# Patient Record
Sex: Male | Born: 1935 | Race: White | Hispanic: No | Smoking: Former smoker
Health system: Southern US, Community
[De-identification: ages and names within clinical notes are randomized; demographics above are authoritative.]

## PROBLEM LIST (undated history)

## (undated) DIAGNOSIS — I714 Abdominal aortic aneurysm, without rupture, unspecified: Secondary | ICD-10-CM

## (undated) DIAGNOSIS — M545 Low back pain, unspecified: Secondary | ICD-10-CM

## (undated) DIAGNOSIS — J309 Allergic rhinitis, unspecified: Secondary | ICD-10-CM

## (undated) DIAGNOSIS — G510 Bell's palsy: Secondary | ICD-10-CM

## (undated) DIAGNOSIS — F0390 Unspecified dementia without behavioral disturbance: Secondary | ICD-10-CM

## (undated) DIAGNOSIS — I4891 Unspecified atrial fibrillation: Secondary | ICD-10-CM

## (undated) DIAGNOSIS — E785 Hyperlipidemia, unspecified: Secondary | ICD-10-CM

## (undated) DIAGNOSIS — R569 Unspecified convulsions: Secondary | ICD-10-CM

## (undated) DIAGNOSIS — G911 Obstructive hydrocephalus: Secondary | ICD-10-CM

## (undated) DIAGNOSIS — C801 Malignant (primary) neoplasm, unspecified: Secondary | ICD-10-CM

## (undated) DIAGNOSIS — I10 Essential (primary) hypertension: Secondary | ICD-10-CM

## (undated) DIAGNOSIS — C439 Malignant melanoma of skin, unspecified: Secondary | ICD-10-CM

## (undated) DIAGNOSIS — K635 Polyp of colon: Secondary | ICD-10-CM

## (undated) DIAGNOSIS — N400 Enlarged prostate without lower urinary tract symptoms: Secondary | ICD-10-CM

## (undated) DIAGNOSIS — I639 Cerebral infarction, unspecified: Secondary | ICD-10-CM

## (undated) DIAGNOSIS — N200 Calculus of kidney: Secondary | ICD-10-CM

## (undated) HISTORY — PX: COLONOSCOPY: SHX174

## (undated) HISTORY — DX: Obstructive hydrocephalus: G91.1

## (undated) HISTORY — DX: Unspecified dementia, unspecified severity, without behavioral disturbance, psychotic disturbance, mood disturbance, and anxiety: F03.90

## (undated) HISTORY — DX: Malignant (primary) neoplasm, unspecified: C80.1

## (undated) HISTORY — DX: Bell's palsy: G51.0

## (undated) HISTORY — DX: Unspecified convulsions: R56.9

## (undated) HISTORY — DX: Hyperlipidemia, unspecified: E78.5

## (undated) HISTORY — DX: Allergic rhinitis, unspecified: J30.9

## (undated) HISTORY — DX: Abdominal aortic aneurysm, without rupture, unspecified: I71.40

## (undated) HISTORY — DX: Polyp of colon: K63.5

## (undated) HISTORY — DX: Essential (primary) hypertension: I10

## (undated) HISTORY — PX: COLON SURGERY: SHX602

## (undated) HISTORY — DX: Low back pain: M54.5

## (undated) HISTORY — DX: Low back pain, unspecified: M54.50

## (undated) HISTORY — PX: NASAL SINUS SURGERY: SHX719

## (undated) HISTORY — DX: Abdominal aortic aneurysm, without rupture: I71.4

## (undated) HISTORY — DX: Cerebral infarction, unspecified: I63.9

## (undated) HISTORY — PX: CHOLECYSTECTOMY: SHX55

## (undated) HISTORY — DX: Calculus of kidney: N20.0

## (undated) HISTORY — DX: Unspecified atrial fibrillation: I48.91

## (undated) HISTORY — DX: Benign prostatic hyperplasia without lower urinary tract symptoms: N40.0

## (undated) HISTORY — DX: Malignant melanoma of skin, unspecified: C43.9

---

## 1999-02-26 ENCOUNTER — Inpatient Hospital Stay (HOSPITAL_COMMUNITY): Admission: EM | Admit: 1999-02-26 | Discharge: 1999-02-27 | Payer: Self-pay | Admitting: *Deleted

## 2000-09-16 DIAGNOSIS — R569 Unspecified convulsions: Secondary | ICD-10-CM

## 2000-09-16 HISTORY — DX: Unspecified convulsions: R56.9

## 2005-11-17 HISTORY — PX: POLYPECTOMY: SHX149

## 2006-03-27 ENCOUNTER — Ambulatory Visit: Payer: Self-pay | Admitting: Cardiology

## 2008-07-17 HISTORY — PX: MELANOMA EXCISION: SHX5266

## 2009-04-24 ENCOUNTER — Ambulatory Visit (HOSPITAL_COMMUNITY): Admission: RE | Admit: 2009-04-24 | Discharge: 2009-04-24 | Payer: Self-pay | Admitting: Neurology

## 2009-08-18 ENCOUNTER — Ambulatory Visit (HOSPITAL_COMMUNITY): Admission: RE | Admit: 2009-08-18 | Discharge: 2009-08-18 | Payer: Self-pay | Admitting: Neurosurgery

## 2009-08-20 ENCOUNTER — Encounter: Admission: RE | Admit: 2009-08-20 | Discharge: 2009-08-20 | Payer: Self-pay | Admitting: Neurology

## 2009-11-03 ENCOUNTER — Inpatient Hospital Stay (HOSPITAL_COMMUNITY): Admission: RE | Admit: 2009-11-03 | Discharge: 2009-11-04 | Payer: Self-pay | Admitting: Neurosurgery

## 2009-12-03 ENCOUNTER — Encounter: Admission: RE | Admit: 2009-12-03 | Discharge: 2009-12-03 | Payer: Self-pay | Admitting: Neurosurgery

## 2010-01-11 ENCOUNTER — Ambulatory Visit: Payer: Self-pay

## 2010-01-11 ENCOUNTER — Ambulatory Visit: Payer: Self-pay | Admitting: Cardiovascular Disease

## 2010-01-11 ENCOUNTER — Encounter (INDEPENDENT_AMBULATORY_CARE_PROVIDER_SITE_OTHER): Payer: Self-pay | Admitting: Neurology

## 2010-01-11 ENCOUNTER — Ambulatory Visit (HOSPITAL_COMMUNITY): Admission: RE | Admit: 2010-01-11 | Discharge: 2010-01-11 | Payer: Self-pay | Admitting: Neurology

## 2010-01-11 ENCOUNTER — Encounter: Payer: Self-pay | Admitting: Internal Medicine

## 2010-11-16 NOTE — Procedures (Signed)
Summary: summary report  summary report   Imported By: Mirna Mires 01/14/2010 11:31:34  _____________________________________________________________________  External Attachment:    Type:   Image     Comment:   External Document

## 2011-01-02 LAB — BASIC METABOLIC PANEL
CO2: 26 mEq/L (ref 19–32)
Calcium: 9.4 mg/dL (ref 8.4–10.5)
Chloride: 103 mEq/L (ref 96–112)
GFR calc Af Amer: >60 mL/min (ref >60)
Glucose, Bld: 109 mg/dL — ABNORMAL HIGH (ref 70–99)
Potassium: 4.2 mEq/L (ref 3.5–5.1)
Sodium: 139 mEq/L (ref 135–145)

## 2011-01-02 LAB — CBC
HCT: 44.1 % (ref 39.0–52.0)
Hemoglobin: 15.4 g/dL (ref 13.0–17.0)
MCHC: 34.8 g/dL (ref 30.0–36.0)
RBC: 4.53 MIL/uL (ref 4.22–5.81)

## 2011-01-02 LAB — DIFFERENTIAL
Basophils Relative: 0 % (ref 0–1)
Eosinophils Relative: 3 % (ref 0–5)
Monocytes Absolute: 0.6 10*3/uL (ref 0.1–1.0)
Monocytes Relative: 11 % (ref 3–12)
Neutro Abs: 3.5 10*3/uL (ref 1.7–7.7)

## 2011-01-19 LAB — CBC
HCT: 46 % (ref 39.0–52.0)
MCV: 96.6 fL (ref 78.0–100.0)
Platelets: 183 10*3/uL (ref 150–400)
RDW: 13.3 % (ref 11.5–15.5)

## 2011-01-19 LAB — BASIC METABOLIC PANEL
BUN: 17 mg/dL (ref 6–23)
Chloride: 103 mEq/L (ref 96–112)
Creatinine, Ser: 0.99 mg/dL (ref 0.4–1.5)
GFR calc non Af Amer: >60 mL/min (ref >60)
Glucose, Bld: 102 mg/dL — ABNORMAL HIGH (ref 70–99)
Potassium: 4.2 mEq/L (ref 3.5–5.1)

## 2011-01-19 LAB — ABO/RH: ABO/RH(D): O POS

## 2011-01-19 LAB — TYPE AND SCREEN: ABO/RH(D): O POS

## 2011-01-19 LAB — DIFFERENTIAL
Basophils Absolute: 0 10*3/uL (ref 0.0–0.1)
Lymphocytes Relative: 35 % (ref 12–46)
Neutro Abs: 2 10*3/uL (ref 1.7–7.7)
Neutrophils Relative %: 46 % (ref 43–77)

## 2011-01-19 IMAGING — CR DG CHEST 2V
2 series · 2 of 2 positions shown · non-contrast
Comparison: None

CLINICAL DATA: Hydrocephalus.  Preadmission shunt placement.

CHEST - 2 VIEW

[view not recorded (1 of 2)]
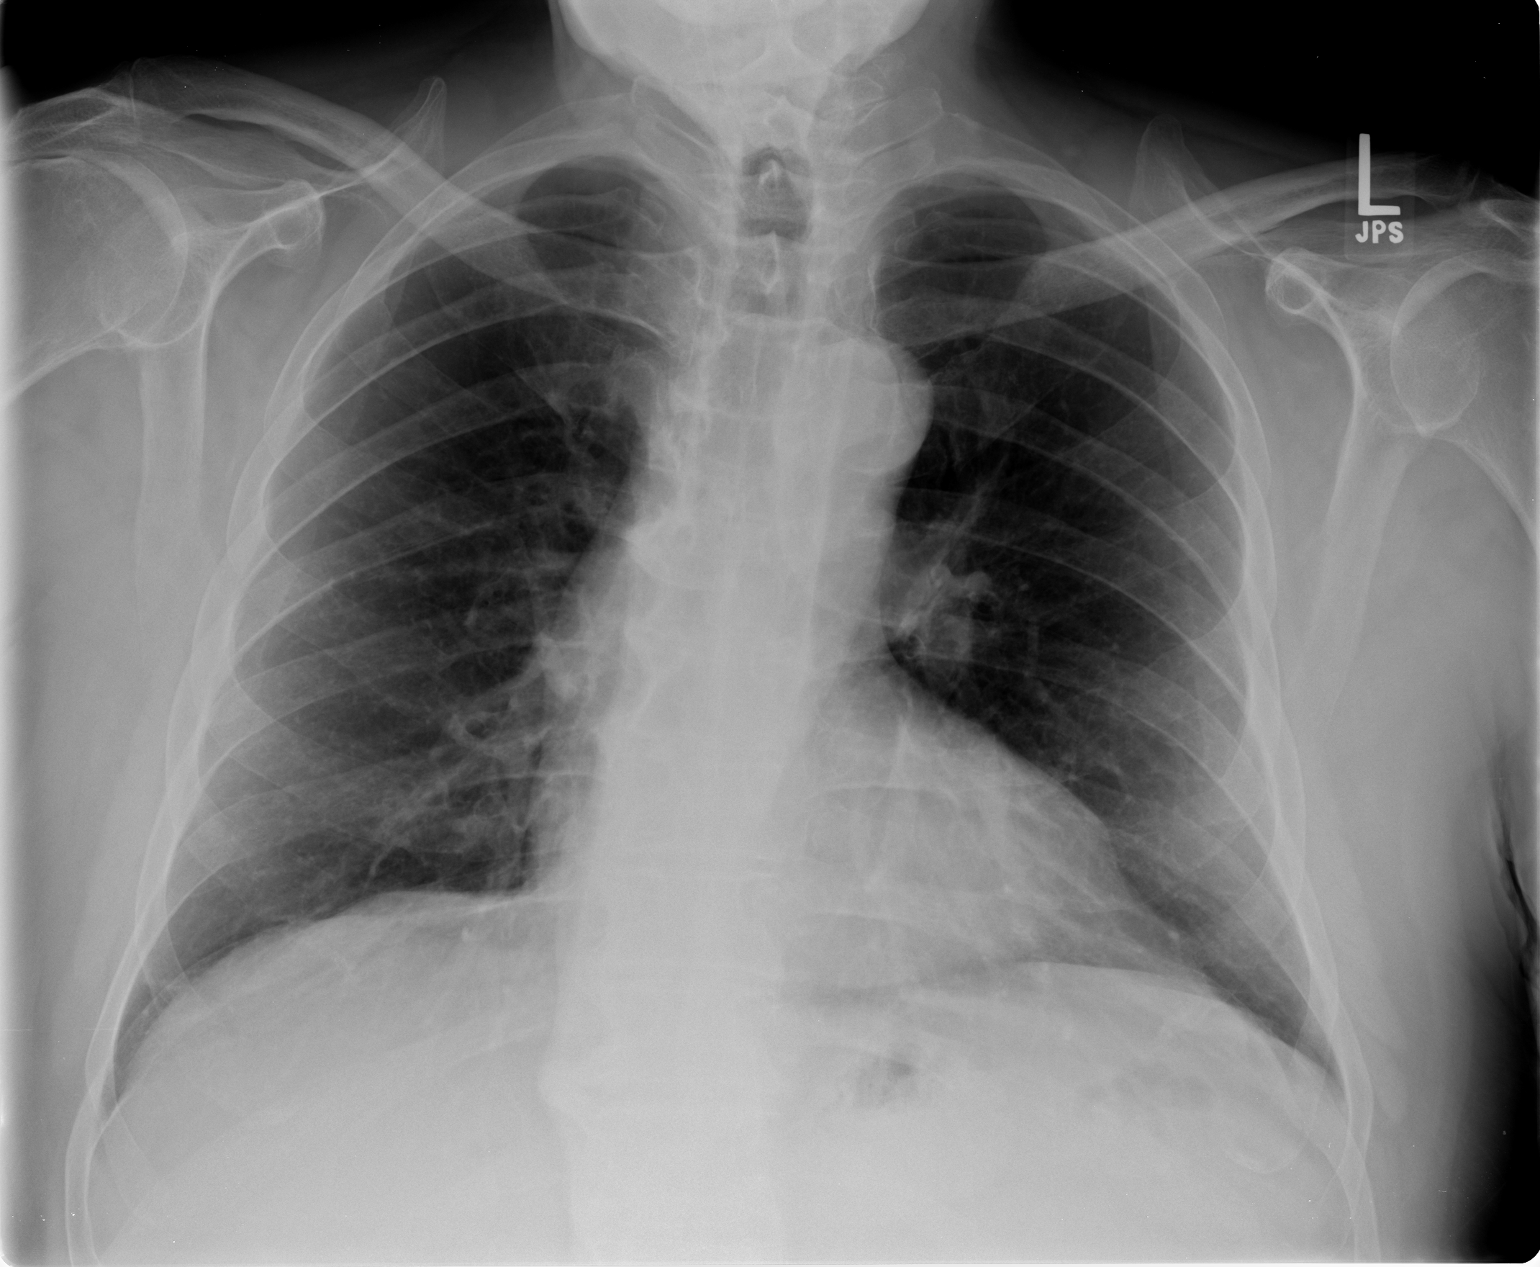

[view not recorded (2 of 2)]
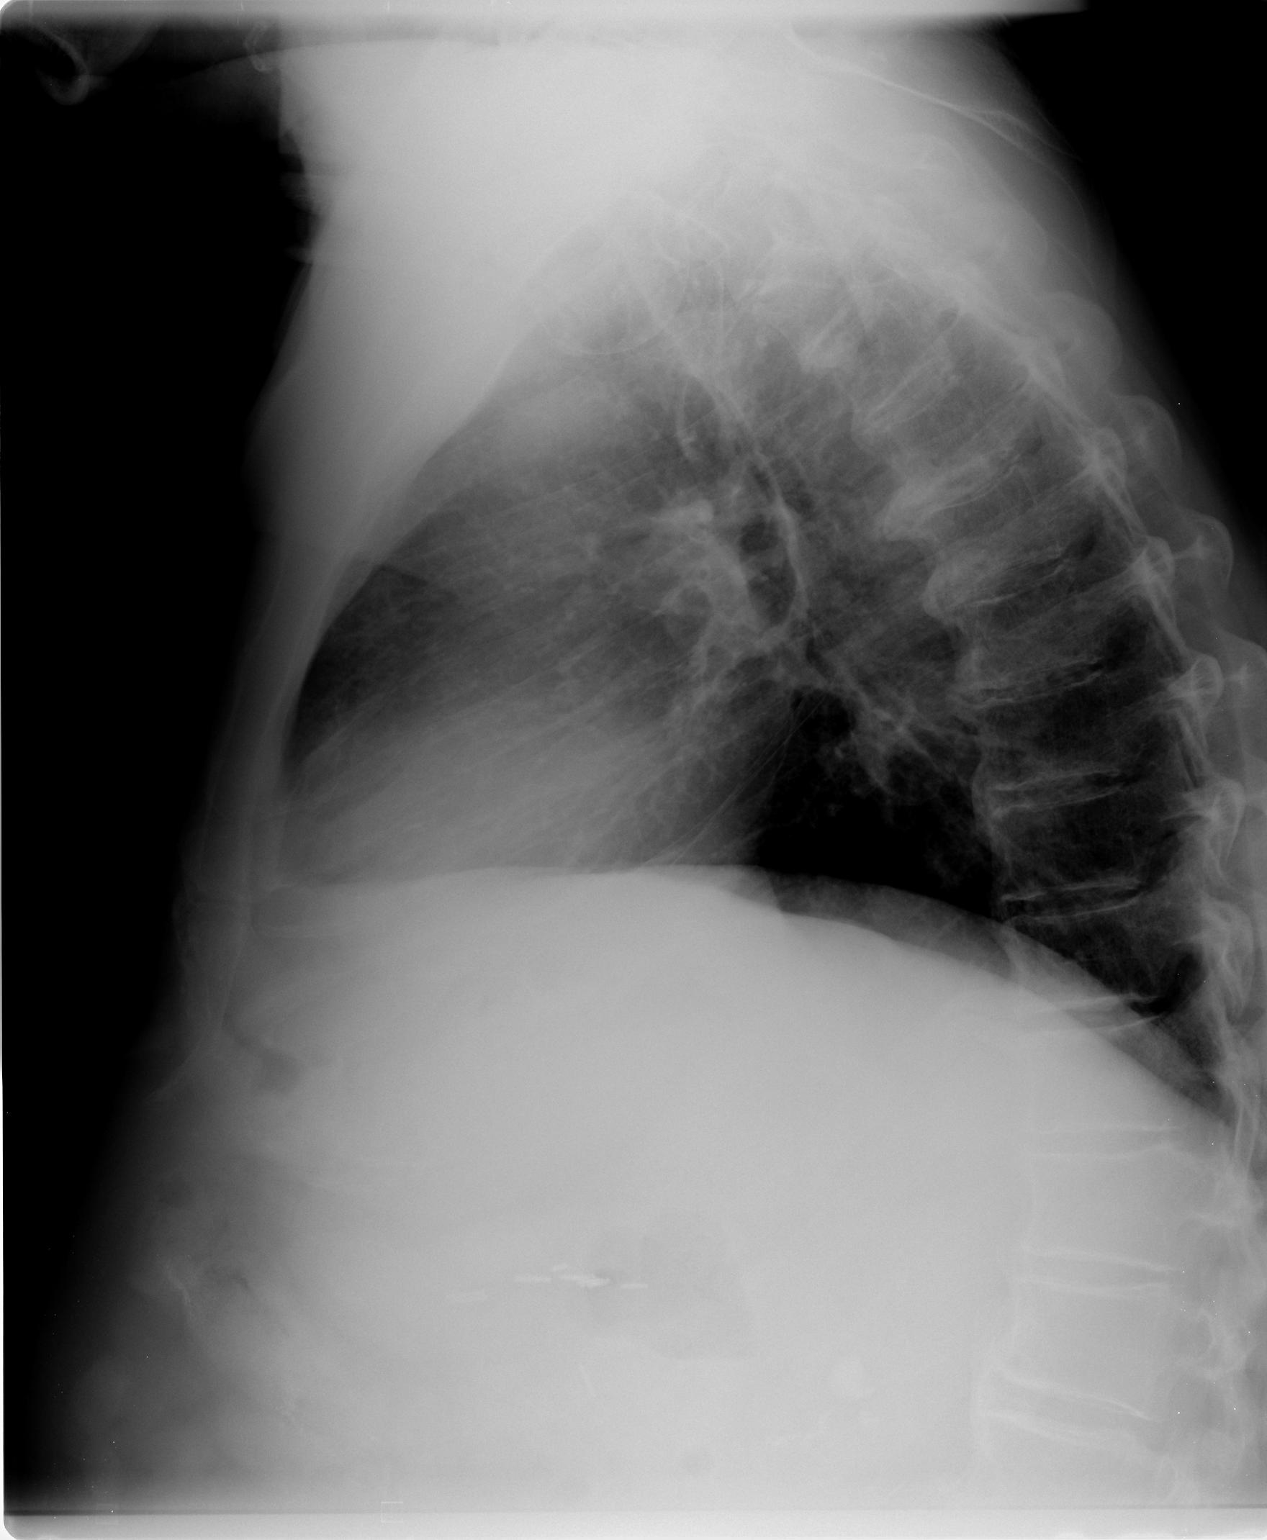

[2 of 2 positions shown; findings below may reference images not displayed]

FINDINGS: The heart size and mediastinal contours are within
normal limits.  Both lungs are clear.  The visualized skeletal
structures are unremarkable.
IMPRESSION: No active cardiopulmonary disease.

## 2011-01-23 LAB — PROTEIN AND GLUCOSE, CSF
Glucose, CSF: 62 mg/dL (ref 43–76)
Total  Protein, CSF: 54 mg/dL — ABNORMAL HIGH (ref 15–45)

## 2011-01-23 LAB — CSF CELL COUNT WITH DIFFERENTIAL

## 2011-06-01 ENCOUNTER — Encounter: Payer: Self-pay | Admitting: Vascular Surgery

## 2011-06-17 ENCOUNTER — Encounter: Payer: Self-pay | Admitting: Vascular Surgery

## 2011-06-21 ENCOUNTER — Ambulatory Visit (INDEPENDENT_AMBULATORY_CARE_PROVIDER_SITE_OTHER): Payer: Medicare Other | Admitting: Vascular Surgery

## 2011-06-21 ENCOUNTER — Encounter: Payer: Self-pay | Admitting: Vascular Surgery

## 2011-06-21 ENCOUNTER — Encounter (INDEPENDENT_AMBULATORY_CARE_PROVIDER_SITE_OTHER): Payer: Medicare Other

## 2011-06-21 VITALS — BP 165/109 | HR 76 | Resp 20 | Ht 68.0 in | Wt 207.0 lb

## 2011-06-21 DIAGNOSIS — I739 Peripheral vascular disease, unspecified: Secondary | ICD-10-CM

## 2011-06-21 DIAGNOSIS — I7092 Chronic total occlusion of artery of the extremities: Secondary | ICD-10-CM

## 2011-06-21 NOTE — Progress Notes (Signed)
The patient presents today for evaluation of right lower extremity arterial insufficiency. He complained of right calf cramping unrelated to exercise. He underwent noninvasive duplex evaluation for DVT. This was negative. He had an incidental finding of right superficial femoral artery occlusion. He specifically denies this being related to walking. He has no history of tissue loss.  Past Medical History  Diagnosis Date  . Hyperlipidemia   . Stroke   . AAA (abdominal aortic aneurysm)   . Kidney stones   . BPH (benign prostatic hyperplasia)   . Seizure 12/01  . Cancer     colon  . Allergic rhinitis, cause unspecified   . Malaise and fatigue   . Frequency of urination and polyuria   . Obstructive hydrocephalus   . Lumbago   . Bell's palsy   . Atrial fibrillation   . Abdominal pain     History  Substance Use Topics  . Smoking status: Former Smoker -- 40 years    Types: Cigars    Quit date: 06/20/2009  . Smokeless tobacco: Never Used  . Alcohol Use: No    Family History  Problem Relation Age of Onset  . Cancer Mother     ovarian cancer  . Hypertension Father     Allergies  Allergen Reactions  . Lopressor (Metoprolol Tartrate)     Causes fatigue and confusion    Current outpatient prescriptions:dabigatran (PRADAXA) 150 MG CAPS, Take 150 mg by mouth every 12 (twelve) hours.  , Disp: , Rfl: ;  diltiazem (CARDIZEM) 120 MG tablet, Take 120 mg by mouth daily.  , Disp: , Rfl: ;  donepezil (ARICEPT) 10 MG tablet, Take 10 mg by mouth at bedtime as needed.  , Disp: , Rfl: ;  finasteride (PROSCAR) 5 MG tablet, Take 5 mg by mouth daily.  , Disp: , Rfl:  levETIRAcetam (KEPPRA) 500 MG tablet, Take 500 mg by mouth 2 (two) times daily.  , Disp: , Rfl: ;  nitrofurantoin (MACRODANTIN) 100 MG capsule, Take 50 mg by mouth every other day. , Disp: , Rfl: ;  pantoprazole (PROTONIX) 40 MG tablet, Take 40 mg by mouth daily.  , Disp: , Rfl: ;  valsartan (DIOVAN) 320 MG tablet, Take 320 mg by mouth  daily.  , Disp: , Rfl:  diclofenac (VOLTAREN) 0.1 % ophthalmic solution, 1 drop 4 (four) times daily as needed.  , Disp: , Rfl: ;  meloxicam (MOBIC) 7.5 MG tablet, Take 7.5 mg by mouth daily.  , Disp: , Rfl:   BP 165/109  Pulse 76  Resp 20  Ht 5\' 8"  (1.727 m)  Wt 207 lb (93.895 kg)  BMI 31.47 kg/m2  Body mass index is 31.47 kg/(m^2).  Review of systems: History of atrial fibrillation, seizure, frequent urination, arthritis, otherwise review of systems negative.  Physical exam: Well-developed well-nourished white male in no acute distress. HEENT normal. Chest clear bilaterally. Heart regular rate and rhythm. 2+ radial and femoral pulses bilaterally. Absent right popliteal and distal pulses. 2+ left dorsalis pedis pulse. Abdomen soft nontender. Musculoskeletal no major deformities or cyanosis. Neurologic no focal weakness. Skin without ulcers or rashes.  Lower extremity arterial Doppler: ABI of 0.51 on the right and 0.98 on the left  Assess: Asymptomatic moderate left lower extremity arterial insufficiency. This was discussed with the patient and daughter. They will note in the issues with tissue loss but otherwise see [place] on an as-needed basis.

## 2012-04-23 ENCOUNTER — Other Ambulatory Visit: Payer: Self-pay | Admitting: Diagnostic Neuroimaging

## 2012-04-23 DIAGNOSIS — G40309 Generalized idiopathic epilepsy and epileptic syndromes, not intractable, without status epilepticus: Secondary | ICD-10-CM

## 2012-04-24 ENCOUNTER — Ambulatory Visit
Admission: RE | Admit: 2012-04-24 | Discharge: 2012-04-24 | Disposition: A | Discharge status: Home / Self Care | Payer: Medicare Other | Source: Ambulatory Visit | Attending: Diagnostic Neuroimaging | Admitting: Diagnostic Neuroimaging

## 2012-04-24 DIAGNOSIS — G40309 Generalized idiopathic epilepsy and epileptic syndromes, not intractable, without status epilepticus: Secondary | ICD-10-CM

## 2012-04-24 MED ORDER — GADOBENATE DIMEGLUMINE 529 MG/ML IV SOLN
20.0000 mL | Freq: Once | INTRAVENOUS | Status: AC | PRN
  Administered 2012-04-24: 20 mL via INTRAVENOUS

## 2013-03-27 ENCOUNTER — Encounter: Payer: Self-pay | Admitting: Diagnostic Neuroimaging

## 2013-03-27 ENCOUNTER — Ambulatory Visit (INDEPENDENT_AMBULATORY_CARE_PROVIDER_SITE_OTHER): Payer: Medicare Other | Admitting: Diagnostic Neuroimaging

## 2013-03-27 VITALS — BP 146/99 | HR 86 | Temp 97.1°F | Wt 211.0 lb

## 2013-03-27 DIAGNOSIS — G40909 Epilepsy, unspecified, not intractable, without status epilepticus: Secondary | ICD-10-CM | POA: Insufficient documentation

## 2013-03-27 DIAGNOSIS — G912 (Idiopathic) normal pressure hydrocephalus: Secondary | ICD-10-CM

## 2013-03-27 NOTE — Patient Instructions (Signed)
Continue current medications. 

## 2013-03-27 NOTE — Progress Notes (Signed)
[facility]  PATIENT: Arval Brandstetter DOB: [birth year]  REFERRING CLINICIAN:  HISTORY FROM: patient and daughter REASON FOR VISIT: follow up   HISTORICAL  CHIEF COMPLAINT:  Chief Complaint  Patient presents with  . Follow-up    Seizure    HISTORY OF PRESENT ILLNESS:   UPDATE 03/27/13: Since last visit, having one staring spell type seizure every 2-3 months. Last 2 events occurred in the middle the night, where patient's daughter found in the morning somewhat confused and complaining of sore muscles. No witnessed convulsions.  UPDATE 07/06/12:  Has had one witnessed seizure on 05/24/12 similar to previous per his daughter.  Tolerating Keppra 1500mg  BID (has to have brand). Denies lack of sleep, dehydration, fever or skipped doses.  No changes in memory loss.  Seen neurosurgeon feels shunt is functioning well.    UPDATE 04/23/12: Returns today after having 2 seizures on 03/28/12 and 04/03/12.  Daughter reports they are similar to his previous seizures except he was disoriented a little longer.  Denies lack of sleep, dehydration, fever or skipped doses.  Denies any urinary symptoms, does have a history of kidney stones and urinary tract infections.  Taking Keppra 1000mg  BID, his daughter did give him an extra Keppra after each seizure.  Denies any neurologic symptoms.  No changes in memory loss.    UPDATE 12/13/11: No new complaints since last visit 02/01/11, states "everything is just fine".  He has not had any episodes of seizures and is tolerating Keppra well, no side effects.  Also tolerating Aricept well.  Denies any falls.  Continues to have urgency but not any worse and no incontinence.  No new stroke symptoms since his stroke in November 2010.  Continues on Pradaxa.  Short term memory remains poor but not worse.   PRIOR HPI: Patient returns today with his daughter for followup. I have followed him in this office for several years. He has a long history of epilepsy,  for which he took Dilantin for many years, and more recently has been on Keppra. He tolerated Depakote poorly. He has had progressive memory problems going back about 5 years, and has been on Aricept. Earlier this year, he was diagnosed with normal pressure hydrocephalus on the basis of MRI findings and response to a high volume lumbar puncture. He was scheduled to have a VP shunt placed by Dr. Jordan Likes on 08/24/09, but developed acute LLE weakness on 08/20/09. Urgent MRI confirmed an acute stroke involving the subcortical white matter of the right precentral gyrus. At that point I placed him back on aspirin and advised that he delay surgery. Had VP shunt 11/03/09, improvement in gait. Last visit February. Since then he had an echo, which was normal, and a Holter monitor which demonstrated paroxysmal atrial fibrillation.  REVIEW OF SYSTEMS: Full 14 system review of systems performed and notable only for seizure.  ALLERGIES: Allergies  Allergen Reactions  . Depakote (Divalproex Sodium)   . Lopressor (Metoprolol Tartrate)     Causes fatigue and confusion  . Phenytek (Phenytoin Sodium Extended)     HOME MEDICATIONS: Outpatient Prescriptions Prior to Visit  Medication Sig Dispense Refill  . dabigatran (PRADAXA) 150 MG CAPS Take 150 mg by mouth every 12 (twelve) hours.        Marland Kitchen donepezil (ARICEPT) 10 MG tablet Take 10 mg by mouth at bedtime as needed.        . finasteride (PROSCAR) 5 MG tablet Take 5 mg by mouth daily.        Marland Kitchen  levETIRAcetam (KEPPRA) 500 MG tablet Take 1,500 mg by mouth 2 (two) times daily.       . pantoprazole (PROTONIX) 40 MG tablet Take 40 mg by mouth daily.        . valsartan (DIOVAN) 320 MG tablet Take 320 mg by mouth daily.        . diclofenac (VOLTAREN) 0.1 % ophthalmic solution 1 drop 4 (four) times daily as needed.        . meloxicam (MOBIC) 7.5 MG tablet Take 7.5 mg by mouth daily.        . nitrofurantoin (MACRODANTIN) 100 MG capsule Take 50 mg by mouth every other day.         . diltiazem (CARDIZEM) 120 MG tablet Take 120 mg by mouth daily.         No facility-administered medications prior to visit.    PAST MEDICAL HISTORY: Past Medical History  Diagnosis Date  . Hyperlipidemia   . Stroke   . AAA (abdominal aortic aneurysm)     Incisional Hernia  . Kidney stones   . BPH (benign prostatic hyperplasia)   . Seizure 12/01  . Cancer     colon  . Allergic rhinitis, cause unspecified   . Malaise and fatigue   . Frequency of urination and polyuria   . Obstructive hydrocephalus   . Lumbago   . Bell's palsy   . Atrial fibrillation   . Abdominal pain   . Dementia   . Hypertension   . Colon polyp     Malignant  . Melanoma     PAST SURGICAL HISTORY: Past Surgical History  Procedure Laterality Date  . Colonoscopy    . Cholecystectomy    . Nasal sinus surgery    . Colon surgery    . Polypectomy  11/2005  . Melanoma excision  07/2008    FAMILY HISTORY: Family History  Problem Relation Age of Onset  . Cancer Mother     ovarian cancer  . Hypertension Father     SOCIAL HISTORY:  History   Social History  . Marital Status: Widowed    Spouse Name: N/A    Number of Children: 1  . Years of Education: 12th   Occupational History  . Retired     Careers adviser   Social History Main Topics  . Smoking status: Former Smoker -- 40 years    Types: Cigars    Quit date: 06/20/2009  . Smokeless tobacco: Never Used  . Alcohol Use: No  . Drug Use: No  . Sexually Active: Not on file   Other Topics Concern  . Not on file   Social History Narrative   Pt lives at home with his daughter.   Caffeine Use:      PHYSICAL EXAM  Filed Vitals:   03/27/13 1210  BP: 146/99  Pulse: 86  Temp: 97.1 F (36.2 C)  TempSrc: Oral  Weight: 211 lb (95.709 kg)    Not recorded    Body mass index is 32.09 kg/(m^2).  EXAM: General: Patient is well developed and well groomed. Patient is awake and alert and in no acute distress. Musculoskeletal: STOOPED  POSTURE Skin: No rash, no bruising  Neurologic Exam  Mental Status: Awake and alert. Speech is fluent; comprehension is intact. JOVIAL. Cranial Nerves: Pupils are equal and round and reactive to light. Conjugate eye movements are full and symmetric. Visual fields are full to confrontation. No evidence of papilledema. Facial sensation and facial muscle strength are symmetric and  in normal limits. Hearing is intact. Palate elevated symmetrically and uvula is midline. Shoulder shrug is symmetric. Tongue is midline. Motor: Symmetric normal motor tone is noted throughout. Normal muscle bulk. Testing reveals 5/5  muscle strength of the upper extremity, and 5/5 for the lower extremity. Full ROM in upper and lower extremities. ROM of the spine is not restricted. Fine motor movements are normal in both hands. Sensory: Intact and symmetric to light touch. Coordination: Cerebellar testing reveals good finger-to-nose and heel-to-shin bilaterally. No tremor, dystaxia or dysmetria noted. Gait and Station: SHORT STEPS, STOOPED POSTURE. Reflexes: DTR in the upper and lower extremity are present and symmetric 2+.   DIAGNOSTIC DATA (LABS, IMAGING, TESTING) - I reviewed patient records, labs, notes, testing and imaging myself where available.  Lab Results  Component Value Date   WBC 5.3 10/30/2009   HGB 15.4 10/30/2009   HCT 44.1 10/30/2009   MCV 97.5 10/30/2009   PLT 175 10/30/2009      Component Value Date/Time   NA 139 10/30/2009 1011   K 4.2 10/30/2009 1011   CL 103 10/30/2009 1011   CO2 26 10/30/2009 1011   GLUCOSE 109* 10/30/2009 1011   BUN 14 10/30/2009 1011   CREATININE 0.85 10/30/2009 1011   CALCIUM 9.4 10/30/2009 1011   GFRNONAA >60 10/30/2009 1011   GFRAA  Value: >60        The eGFR has been calculated using the MDRD equation. This calculation has not been validated in all clinical situations. eGFR's persistently <60 mL/min signify possible Chronic Kidney Disease. 10/30/2009 1011   No results found for  this basename: CHOL, HDL, LDLCALC, LDLDIRECT, TRIG, CHOLHDL   No results found for this basename: HGBA1C   No results found for this basename: VITAMINB12   No results found for this basename: TSH    04/24/2012 MRI brain - shows diffuse pachymeningeal enhancement.  May be due to intracranial hypotension associated with ventricular shunt. Focal T1 hyperintense, ependymal lesion in the left anterior frontal horn, may be fatty or proteinaceous cyst.     ASSESSMENT AND PLAN  79 y.o. male with seizure disorder (1 every 2-3 months) with staring spells nowadays. On Keppra 1500mg  BID.  Last staring spell seizure on May 2014. No witnessed convulsions in several years. Doing well today. Hasn't driven in many years.   Plan 1. Continue LEV 1500mg  BID   Suanne Marker, MD 03/27/2013, 12:45 PM Certified in Neurology, Neurophysiology and Neuroimaging   [facility] [address] [place], [place] [address] [phone]

## 2013-05-29 ENCOUNTER — Other Ambulatory Visit: Payer: Self-pay | Admitting: Diagnostic Neuroimaging

## 2013-11-23 ENCOUNTER — Other Ambulatory Visit: Payer: Self-pay | Admitting: Diagnostic Neuroimaging

## 2013-12-10 ENCOUNTER — Ambulatory Visit: Payer: Medicare Other | Admitting: Diagnostic Neuroimaging

## 2013-12-10 ENCOUNTER — Telehealth: Payer: Self-pay

## 2013-12-10 NOTE — Telephone Encounter (Signed)
Called patient to see if they were coming in for appt later this afternoon. Daughter state they called in this a.m., will not be in. Will wait on call to reschedule as told this morning.

## 2013-12-16 ENCOUNTER — Encounter (INDEPENDENT_AMBULATORY_CARE_PROVIDER_SITE_OTHER): Payer: Self-pay

## 2013-12-16 ENCOUNTER — Ambulatory Visit (INDEPENDENT_AMBULATORY_CARE_PROVIDER_SITE_OTHER): Payer: Medicare Other | Admitting: Diagnostic Neuroimaging

## 2013-12-16 ENCOUNTER — Encounter: Payer: Self-pay | Admitting: Diagnostic Neuroimaging

## 2013-12-16 VITALS — BP 123/82 | HR 74 | Wt 215.0 lb

## 2013-12-16 DIAGNOSIS — I639 Cerebral infarction, unspecified: Secondary | ICD-10-CM | POA: Insufficient documentation

## 2013-12-16 DIAGNOSIS — I4891 Unspecified atrial fibrillation: Secondary | ICD-10-CM

## 2013-12-16 DIAGNOSIS — I6350 Cerebral infarction due to unspecified occlusion or stenosis of unspecified cerebral artery: Secondary | ICD-10-CM

## 2013-12-16 DIAGNOSIS — G912 (Idiopathic) normal pressure hydrocephalus: Secondary | ICD-10-CM

## 2013-12-16 DIAGNOSIS — F0390 Unspecified dementia without behavioral disturbance: Secondary | ICD-10-CM | POA: Insufficient documentation

## 2013-12-16 DIAGNOSIS — G40909 Epilepsy, unspecified, not intractable, without status epilepticus: Secondary | ICD-10-CM

## 2013-12-16 DIAGNOSIS — F03B0 Unspecified dementia, moderate, without behavioral disturbance, psychotic disturbance, mood disturbance, and anxiety: Secondary | ICD-10-CM

## 2013-12-16 NOTE — Progress Notes (Signed)
[facility]  PATIENT: Carlos Ballard DOB: 12-Nov-1927  REFERRING CLINICIAN:  HISTORY FROM: patient and daughter REASON FOR VISIT: follow up   HISTORICAL  CHIEF COMPLAINT:  No chief complaint on file.   HISTORY OF PRESENT ILLNESS:   UPDATE 12/16/13: Since last visit, doing well. No seizures. Memory loss slightly progressive. Daughter takes care of his meds, meals; patient still able to feed and dress himself. No wandering. No safety issues. No falls. Still on pradaxa. Living will is in place.  UPDATE 03/27/13: Since last visit, having one staring spell type seizure every 2-3 months. Last 2 events occurred in the middle the night, where patient's daughter found in the morning somewhat confused and complaining of sore muscles. No witnessed convulsions.  UPDATE 07/06/12:  Has had one witnessed seizure on 05/24/12 similar to previous per his daughter.  Tolerating Keppra 1541m BID (has to have brand). Denies lack of sleep, dehydration, fever or skipped doses.  No changes in memory loss.  Seen neurosurgeon feels shunt is functioning well.    UPDATE 04/23/12: Returns today after having 2 seizures on 03/28/12 and 04/03/12.  Daughter reports they are similar to his previous seizures except he was disoriented a little longer.  Denies lack of sleep, dehydration, fever or skipped doses.  Denies any urinary symptoms, does have a history of kidney stones and urinary tract infections.  Taking Keppra 10059mBID, his daughter did give him an extra Keppra after each seizure.  Denies any neurologic symptoms.  No changes in memory loss.    UPDATE 12/13/11: No new complaints since last visit 02/01/11, states "everything is just fine".  He has not had any episodes of seizures and is tolerating Keppra well, no side effects.  Also tolerating Aricept well.  Denies any falls.  Continues to have urgency but not any worse and no incontinence.  No new stroke symptoms since his stroke in November 2010.   Continues on Pradaxa.  Short term memory remains poor but not worse.   PRIOR HPI: Patient returns today with his daughter for followup. I have followed him in this office for several years. He has a long history of epilepsy, for which he took Dilantin for many years, and more recently has been on Keppra. He tolerated Depakote poorly. He has had progressive memory problems going back about 5 years, and has been on Aricept. Earlier this year, he was diagnosed with normal pressure hydrocephalus on the basis of MRI findings and response to a high volume lumbar puncture. He was scheduled to have a VP shunt placed by Dr. PoAnnette Stablen 08/24/09, but developed acute LLE weakness on 08/20/09. Urgent MRI confirmed an acute stroke involving the subcortical white matter of the right precentral gyrus. At that point I placed him back on aspirin and advised that he delay surgery. Had VP shunt 11/03/09, improvement in gait. Last visit February. Since then he had an echo, which was normal, and a Holter monitor which demonstrated paroxysmal atrial fibrillation.  REVIEW OF SYSTEMS: Full 14 system review of systems performed and notable only for memory loss.  ALLERGIES: Allergies  Allergen Reactions  . Depakote [Divalproex Sodium]   . Phenytek [Phenytoin Sodium Extended]     HOME MEDICATIONS: Outpatient Prescriptions Prior to Visit  Medication Sig Dispense Refill  . dabigatran (PRADAXA) 150 MG CAPS Take 150 mg by mouth every 12 (twelve) hours.        . Marland Kitcheniltiazem (CARDIZEM CD) 240 MG 24 hr capsule Take 240 mg by mouth  daily.      . donepezil (ARICEPT) 10 MG tablet Take 10 mg by mouth at bedtime as needed.        . finasteride (PROSCAR) 5 MG tablet Take 5 mg by mouth daily.        Marland Kitchen KEPPRA 1000 MG tablet TAKE 1 AND 1/2 TABLET TWICE A DAY  270 tablet  0  . pantoprazole (PROTONIX) 40 MG tablet Take 40 mg by mouth daily.        . valsartan (DIOVAN) 320 MG tablet Take 320 mg by mouth daily.        . diclofenac (VOLTAREN) 0.1 %  ophthalmic solution 1 drop 4 (four) times daily as needed.        . meloxicam (MOBIC) 7.5 MG tablet Take 7.5 mg by mouth daily.        . nitrofurantoin (MACRODANTIN) 100 MG capsule Take 50 mg by mouth every other day.        No facility-administered medications prior to visit.    PAST MEDICAL HISTORY: Past Medical History  Diagnosis Date  . Hyperlipidemia   . Stroke   . AAA (abdominal aortic aneurysm)     Incisional Hernia  . Kidney stones   . BPH (benign prostatic hyperplasia)   . Seizure 12/01  . Cancer     colon  . Allergic rhinitis, cause unspecified   . Malaise and fatigue   . Frequency of urination and polyuria   . Obstructive hydrocephalus   . Lumbago   . Bell's palsy   . Atrial fibrillation   . Abdominal pain   . Dementia   . Hypertension   . Colon polyp     Malignant  . Melanoma     PAST SURGICAL HISTORY: Past Surgical History  Procedure Laterality Date  . Colonoscopy    . Cholecystectomy    . Nasal sinus surgery    . Colon surgery    . Polypectomy  11/2005  . Melanoma excision  07/2008    FAMILY HISTORY: Family History  Problem Relation Age of Onset  . Cancer Mother     ovarian cancer  . Hypertension Father     SOCIAL HISTORY:  History   Social History  . Marital Status: Widowed    Spouse Name: N/A    Number of Children: 1  . Years of Education: 12th   Occupational History  . Retired     Lawyer   Social History Main Topics  . Smoking status: Former Smoker -- 40 years    Types: Cigars    Quit date: 06/20/2009  . Smokeless tobacco: Never Used  . Alcohol Use: No  . Drug Use: No  . Sexual Activity: Not on file   Other Topics Concern  . Not on file   Social History Narrative   Pt lives at home with his daughter.   Caffeine Use:      PHYSICAL EXAM  Filed Vitals:   12/16/13 1111  BP: 123/82  Pulse: 74  Weight: 215 lb (97.523 kg)    Not recorded    Body mass index is 32.7 kg/(m^2).  EXAM: General: Patient is well  developed and well groomed. Patient is awake and alert and in no acute distress. Musculoskeletal: STOOPED POSTURE Skin: No rash, no bruising  Neurologic Exam  Mental Status: Awake and alert. Speech is fluent; comprehension is intact. JOVIAL. MMSE 18/30. (Keeps asking about: "I want to improve my golf game"). Cranial Nerves: Pupils are equal and round and  reactive to light. Conjugate eye movements are full and symmetric. Visual fields are full to confrontation. No evidence of papilledema. Facial sensation and facial muscle strength are symmetric and in normal limits. Hearing is intact. Palate elevated symmetrically and uvula is midline. Shoulder shrug is symmetric. Tongue is midline. Motor: Symmetric normal motor tone is noted throughout. Normal muscle bulk. Testing reveals 5/5  muscle strength of the upper extremity, and 5/5 for the lower extremity. Full ROM in upper and lower extremities. ROM of the spine is not restricted. Fine motor movements are normal in both hands. Sensory: Intact and symmetric to light touch. Coordination: Cerebellar testing reveals good finger-to-nose and heel-to-shin bilaterally. No tremor, dystaxia or dysmetria noted. Gait and Station: SHORT STEPS, STOOPED POSTURE. Reflexes: DTR in the upper and lower extremity are present and symmetric 2+.   DIAGNOSTIC DATA (LABS, IMAGING, TESTING) - I reviewed patient records, labs, notes, testing and imaging myself where available.  Lab Results  Component Value Date   WBC 5.3 10/30/2009   HGB 15.4 10/30/2009   HCT 44.1 10/30/2009   MCV 97.5 10/30/2009   PLT 175 10/30/2009      Component Value Date/Time   NA 139 10/30/2009 1011   K 4.2 10/30/2009 1011   CL 103 10/30/2009 1011   CO2 26 10/30/2009 1011   GLUCOSE 109* 10/30/2009 1011   BUN 14 10/30/2009 1011   CREATININE 0.85 10/30/2009 1011   CALCIUM 9.4 10/30/2009 1011   GFRNONAA >60 10/30/2009 1011   GFRAA  Value: >60        The eGFR has been calculated using the MDRD equation. This  calculation has not been validated in all clinical situations. eGFR's persistently <60 mL/min signify possible Chronic Kidney Disease. 10/30/2009 1011   No results found for this basename: CHOL,  HDL,  LDLCALC,  LDLDIRECT,  TRIG,  CHOLHDL   No results found for this basename: HGBA1C   No results found for this basename: VITAMINB12   No results found for this basename: TSH    04/24/2012 MRI brain - shows diffuse pachymeningeal enhancement.  May be due to intracranial hypotension associated with ventricular shunt. Focal T1 hyperintense, ependymal lesion in the left anterior frontal horn, may be fatty or proteinaceous cyst.     ASSESSMENT AND PLAN  80 y.o. male with seizure disorder (1 every 2-3 months) with staring spells nowadays. On Keppra 158m BID.  Last staring spell seizure on May 2014. No witnessed convulsions in several years. Doing well today. Hasn't driven in many years. MMSE 18/30 on 12/16/13.  Dx: seizure disorder + moderate dementia + NPH  Plan: 1. Continue LEV 15071mBID 2. Continue donepezil 1069maily 2. Discussed anticoagulation for atrial fibrillation and stroke prevention, given his moderate dementia; he has living will in place (DNR) and would not want aggressive/heroic measures if he were to have life threatening bleeding. Therefore, it is reasonable to continue pradaxa at this time.  Return in about 1 year (around 12/17/2014) for with LynCharlott Holler Penumalli.    VIKPenni BombardD 3/27/6/39432:20:03 Certified in Neurology, Neurophysiology and Neuroimaging  [facility] [address]uiLoraineGanisterC [phone]

## 2013-12-16 NOTE — Patient Instructions (Signed)
Continue levetiracetam. 

## 2014-02-21 ENCOUNTER — Other Ambulatory Visit: Payer: Self-pay | Admitting: Diagnostic Neuroimaging

## 2014-11-14 ENCOUNTER — Telehealth: Payer: Self-pay | Admitting: *Deleted

## 2014-11-14 NOTE — Telephone Encounter (Signed)
Left message and asked pt to call back to the office and reschedule his follow-up appt. Dr. Leta Baptist will not be in the office 12/17/14

## 2014-12-09 ENCOUNTER — Telehealth: Payer: Self-pay | Admitting: *Deleted

## 2014-12-09 NOTE — Telephone Encounter (Signed)
Called and left a message explaining that Dr. Leta Baptist will not be in the office on 3/3 and he needs to reschedule his follow-up appt with [place]. I asked him to call back, when he does please reschedule

## 2014-12-17 ENCOUNTER — Ambulatory Visit: Payer: Medicare Other | Admitting: Diagnostic Neuroimaging

## 2014-12-18 ENCOUNTER — Ambulatory Visit: Payer: Self-pay | Admitting: Diagnostic Neuroimaging

## 2015-01-08 ENCOUNTER — Ambulatory Visit (INDEPENDENT_AMBULATORY_CARE_PROVIDER_SITE_OTHER): Payer: Medicare Other | Admitting: Diagnostic Neuroimaging

## 2015-01-08 ENCOUNTER — Encounter: Payer: Self-pay | Admitting: Diagnostic Neuroimaging

## 2015-01-08 VITALS — BP 132/90 | HR 81 | Ht 68.0 in | Wt 203.0 lb

## 2015-01-08 DIAGNOSIS — G40909 Epilepsy, unspecified, not intractable, without status epilepticus: Secondary | ICD-10-CM | POA: Diagnosis not present

## 2015-01-08 DIAGNOSIS — F03B0 Unspecified dementia, moderate, without behavioral disturbance, psychotic disturbance, mood disturbance, and anxiety: Secondary | ICD-10-CM

## 2015-01-08 DIAGNOSIS — G912 (Idiopathic) normal pressure hydrocephalus: Secondary | ICD-10-CM | POA: Diagnosis not present

## 2015-01-08 DIAGNOSIS — F0390 Unspecified dementia without behavioral disturbance: Secondary | ICD-10-CM

## 2015-01-08 MED ORDER — DONEPEZIL HCL 10 MG PO TABS: 10.0000 mg | ORAL_TABLET | Freq: Every day | ORAL | Status: DC

## 2015-01-08 MED ORDER — LEVETIRACETAM 750 MG PO TABS: 1500.0000 mg | ORAL_TABLET | Freq: Two times a day (BID) | ORAL | Status: DC

## 2015-01-08 NOTE — Progress Notes (Signed)
[facility]  PATIENT: Carlos Ballard DOB: 1928/02/09  REFERRING CLINICIAN:  HISTORY FROM: patient and daughter REASON FOR VISIT: follow up   HISTORICAL  CHIEF COMPLAINT:  Chief Complaint  Patient presents with  . Follow-up    moderate dementia     HISTORY OF PRESENT ILLNESS:   UPDATE 01/08/15: Since last visit, tripped and fell in Sept 2015, leading to right ankle / fibula fracture; treated at [place] with a cast, no surgery. Otherwise stable. Having 2-5 staring spells per month, with mouth chewing, and possible seizure related. No grand mal or convulsive seizures.   UPDATE 12/16/13: Since last visit, doing well. No seizures. Memory loss slightly progressive. Daughter takes care of his meds, meals; patient still able to feed and dress himself. No wandering. No safety issues. No falls. Still on pradaxa. Living will is in place.  UPDATE 03/27/13: Since last visit, having one staring spell type seizure every 2-3 months. Last 2 events occurred in the middle the night, where patient's daughter found in the morning somewhat confused and complaining of sore muscles. No witnessed convulsions.  UPDATE 07/06/12:  Has had one witnessed seizure on 05/24/12 similar to previous per his daughter.  Tolerating Keppra 1575m BID (has to have brand). Denies lack of sleep, dehydration, fever or skipped doses.  No changes in memory loss.  Seen neurosurgeon feels shunt is functioning well.    UPDATE 04/23/12: Returns today after having 2 seizures on 03/28/12 and 04/03/12.  Daughter reports they are similar to his previous seizures except he was disoriented a little longer.  Denies lack of sleep, dehydration, fever or skipped doses.  Denies any urinary symptoms, does have a history of kidney stones and urinary tract infections.  Taking Keppra 10084mBID, his daughter did give him an extra Keppra after each seizure.  Denies any neurologic symptoms.  No changes in memory loss.    UPDATE  12/13/11: No new complaints since last visit 02/01/11, states "everything is just fine".  He has not had any episodes of seizures and is tolerating Keppra well, no side effects.  Also tolerating Aricept well.  Denies any falls.  Continues to have urgency but not any worse and no incontinence.  No new stroke symptoms since his stroke in November 2010.  Continues on Pradaxa.  Short term memory remains poor but not worse.   PRIOR HPI: Patient returns today with his daughter for followup. I have followed him in this office for several years. He has a long history of epilepsy, for which he took Dilantin for many years, and more recently has been on Keppra. He tolerated Depakote poorly. He has had progressive memory problems going back about 5 years, and has been on Aricept. Earlier this year, he was diagnosed with normal pressure hydrocephalus on the basis of MRI findings and response to a high volume lumbar puncture. He was scheduled to have a VP shunt placed by Dr. PoAnnette Stablen 08/24/09, but developed acute LLE weakness on 08/20/09. Urgent MRI confirmed an acute stroke involving the subcortical white matter of the right precentral gyrus. At that point I placed him back on aspirin and advised that he delay surgery. Had VP shunt 11/03/09, improvement in gait. Last visit February. Since then he had an echo, which was normal, and a Holter monitor which demonstrated paroxysmal atrial fibrillation.  REVIEW OF SYSTEMS: Full 14 system review of systems performed and notable only for memory loss / seizure.  ALLERGIES: Allergies  Allergen Reactions  .  Depakote [Divalproex Sodium]   . Phenytek [Phenytoin Sodium Extended]     HOME MEDICATIONS: Outpatient Prescriptions Prior to Visit  Medication Sig Dispense Refill  . dabigatran (PRADAXA) 150 MG CAPS Take 150 mg by mouth every 12 (twelve) hours.      Marland Kitchen diltiazem (CARDIZEM CD) 240 MG 24 hr capsule Take 240 mg by mouth daily.    . finasteride (PROSCAR) 5 MG tablet Take 5 mg  by mouth daily.      . pantoprazole (PROTONIX) 40 MG tablet Take 40 mg by mouth daily.      . valsartan (DIOVAN) 320 MG tablet Take 320 mg by mouth daily.      Marland Kitchen donepezil (ARICEPT) 10 MG tablet Take 10 mg by mouth at bedtime as needed.      Marland Kitchen KEPPRA 1000 MG tablet TAKE 1 AND 1/2 TABLET TWICE A DAY 270 tablet 3  . loratadine (CLARITIN) 10 MG tablet Take 10 mg by mouth daily.     No facility-administered medications prior to visit.    PAST MEDICAL HISTORY: Past Medical History  Diagnosis Date  . Hyperlipidemia   . Stroke   . AAA (abdominal aortic aneurysm)     Incisional Hernia  . Kidney stones   . BPH (benign prostatic hyperplasia)   . Seizure 12/01  . Cancer     colon  . Allergic rhinitis, cause unspecified   . Malaise and fatigue   . Frequency of urination and polyuria   . Obstructive hydrocephalus   . Lumbago   . Bell's palsy   . Atrial fibrillation   . Abdominal pain   . Dementia   . Hypertension   . Colon polyp     Malignant  . Melanoma     PAST SURGICAL HISTORY: Past Surgical History  Procedure Laterality Date  . Colonoscopy    . Cholecystectomy    . Nasal sinus surgery    . Colon surgery    . Polypectomy  11/2005  . Melanoma excision  07/2008    FAMILY HISTORY: Family History  Problem Relation Age of Onset  . Cancer Mother     ovarian cancer  . Hypertension Father     SOCIAL HISTORY:  History   Social History  . Marital Status: Widowed    Spouse Name: N/A  . Number of Children: 1  . Years of Education: 12th   Occupational History  . Retired     Lawyer   Social History Main Topics  . Smoking status: Former Smoker -- 40 years    Types: Cigars    Quit date: 06/20/2009  . Smokeless tobacco: Never Used  . Alcohol Use: No  . Drug Use: No  . Sexual Activity: Not on file   Other Topics Concern  . Not on file   Social History Narrative   Pt lives at home with his daughter.   Caffeine Use:      PHYSICAL EXAM  Filed Vitals:    01/08/15 1517  BP: 132/90  Pulse: 81  Height: 5' 8"  (1.727 m)  Weight: 203 lb (92.08 kg)    Not recorded      Body mass index is 30.87 kg/(m^2).  MMSE - Mini Mental State Exam 01/08/2015  Orientation to time 2  Orientation to Place 4  Registration 3  Attention/ Calculation 1  Recall 0  Language- name 2 objects 2  Language- repeat 0  Language- follow 3 step command 3  Language- read & follow direction 1  Write  a sentence 0  Copy design 0  Total score 16     EXAM: General: Patient is well developed and well groomed. Patient is awake and alert and in no acute distress. Musculoskeletal: STOOPED POSTURE Skin: No rash, no bruising  Neurologic Exam  Mental Status: Awake and alert. Speech is fluent; comprehension is intact. JOVIAL. NEGATIVE SNOUT; BORDERLINE MYERSONS. Cranial Nerves: Pupils are equal and round and reactive to light. Conjugate eye movements are full and symmetric. Visual fields are full to confrontation. No evidence of papilledema. Facial sensation and facial muscle strength are symmetric and in normal limits. Hearing is DECR. Palate elevated symmetrically and uvula is midline. Shoulder shrug is symmetric. Tongue is midline. Motor: Symmetric normal motor tone is noted throughout. Normal muscle bulk. Testing reveals 5/5  muscle strength of the upper extremity, and 5/5 for the lower extremity. Full ROM in upper and lower extremities. ROM of the spine is not restricted. Fine motor movements are normal in both hands. Sensory: Intact and symmetric to light touch. Coordination: FTN NORMAL; FINGER TAPPING AND FOOT TAPPING SYMM AND SMOOTH Gait and Station: SHORT STEPS, STOOPED POSTURE. Reflexes: DTR in the upper and lower extremity are present and symmetric 2+.     DIAGNOSTIC DATA (LABS, IMAGING, TESTING) - I reviewed patient records, labs, notes, testing and imaging myself where available.  Lab Results  Component Value Date   WBC 5.3 10/30/2009   HGB 15.4 10/30/2009    HCT 44.1 10/30/2009   MCV 97.5 10/30/2009   PLT 175 10/30/2009      Component Value Date/Time   NA 139 10/30/2009 1011   K 4.2 10/30/2009 1011   CL 103 10/30/2009 1011   CO2 26 10/30/2009 1011   GLUCOSE 109* 10/30/2009 1011   BUN 14 10/30/2009 1011   CREATININE 0.85 10/30/2009 1011   CALCIUM 9.4 10/30/2009 1011   GFRNONAA >60 10/30/2009 1011   GFRAA  10/30/2009 1011    >60        The eGFR has been calculated using the MDRD equation. This calculation has not been validated in all clinical situations. eGFR's persistently <60 mL/min signify possible Chronic Kidney Disease.   No results found for: CHOL No results found for: HGBA1C No results found for: VITAMINB12 No results found for: TSH  04/24/2012 MRI brain - shows diffuse pachymeningeal enhancement.  May be due to intracranial hypotension associated with ventricular shunt. Focal T1 hyperintense, ependymal lesion in the left anterior frontal horn, may be fatty or proteinaceous cyst.     ASSESSMENT AND PLAN  81 y.o. male with seizure disorder with staring spells, possible complex partial seizures.  On Keppra 1539m BID. No witnessed convulsions in several years.   Dx: seizure disorder + moderate dementia + NPH  Plan: 1. Continue LEV 15028mBID 2. Continue donepezil 1059maily 3. Caution with fall risk, unsupervised time, and progressive dementia  Return in about 1 year (around 01/08/2016).   I spent 15 minutes of face to face time with patient. Greater than 50% of time was spent in counseling and coordination of care with patient.     VIKPenni BombardD 01/11/40/4239:25:32 Certified in Neurology, Neurophysiology and Neuroimaging  [facility] [id number]uiMayvilleeLyndonC [phone]

## 2015-01-08 NOTE — Patient Instructions (Signed)
Continue current medications.  Caution with leaving patient alone.   Caution with tripping / falling risk.

## 2015-12-17 ENCOUNTER — Other Ambulatory Visit: Payer: Self-pay | Admitting: *Deleted

## 2015-12-17 MED ORDER — LEVETIRACETAM 750 MG PO TABS: 1500.0000 mg | ORAL_TABLET | Freq: Two times a day (BID) | ORAL | Status: DC

## 2015-12-24 ENCOUNTER — Telehealth: Payer: Self-pay | Admitting: Diagnostic Neuroimaging

## 2015-12-24 MED ORDER — LEVETIRACETAM 750 MG PO TABS: 1500.0000 mg | ORAL_TABLET | Freq: Two times a day (BID) | ORAL | Status: DC

## 2015-12-24 NOTE — Telephone Encounter (Signed)
Spoke with Myra, patient's daughter who stated pt was mailed generic for Keppra. She stated he needs brand name and has for a long time. She stated he had break through seizures when put on generic years ago. She requested a new prescription sent. Per Dr Leta Baptist, sent in new script for brand medically necessary. Attempted to call daughter to inform, unable to LVM.

## 2015-12-24 NOTE — Telephone Encounter (Signed)
Pt's daughter called asking that new rx be sent to [facility]. Pts rx for levETIRAcetam (KEPPRA) 750 MG tablet was filled and pt was given generic. Daughter states he can only have the brand or the pt will have break through seizures. This normally is not a problem for them to get brand name. Has something changed. May call [phone]

## 2016-01-12 ENCOUNTER — Ambulatory Visit: Payer: Medicare Other | Admitting: Diagnostic Neuroimaging

## 2016-01-19 ENCOUNTER — Ambulatory Visit (INDEPENDENT_AMBULATORY_CARE_PROVIDER_SITE_OTHER): Payer: Medicare Other | Admitting: Diagnostic Neuroimaging

## 2016-01-19 ENCOUNTER — Encounter: Payer: Self-pay | Admitting: Diagnostic Neuroimaging

## 2016-01-19 VITALS — BP 124/82 | HR 75 | Ht 68.0 in | Wt 207.0 lb

## 2016-01-19 DIAGNOSIS — G912 (Idiopathic) normal pressure hydrocephalus: Secondary | ICD-10-CM

## 2016-01-19 DIAGNOSIS — G40909 Epilepsy, unspecified, not intractable, without status epilepticus: Secondary | ICD-10-CM

## 2016-01-19 DIAGNOSIS — F03B0 Unspecified dementia, moderate, without behavioral disturbance, psychotic disturbance, mood disturbance, and anxiety: Secondary | ICD-10-CM

## 2016-01-19 DIAGNOSIS — F0390 Unspecified dementia without behavioral disturbance: Secondary | ICD-10-CM

## 2016-01-19 MED ORDER — LEVETIRACETAM 750 MG PO TABS: 1500.0000 mg | ORAL_TABLET | Freq: Two times a day (BID) | ORAL | Status: DC

## 2016-01-19 MED ORDER — MEMANTINE HCL 10 MG PO TABS: 10.0000 mg | ORAL_TABLET | Freq: Two times a day (BID) | ORAL | Status: DC

## 2016-01-19 MED ORDER — DONEPEZIL HCL 10 MG PO TABS: 10.0000 mg | ORAL_TABLET | Freq: Every day | ORAL | Status: DC

## 2016-01-19 NOTE — Patient Instructions (Signed)
Thank you for coming to see [place] at [facility]. I hope we have been able to provide you high quality care today.  You may receive a patient satisfaction survey over the next few weeks. We would appreciate your feedback and comments so that we may continue to improve ourselves and the health of our patients.  - start memantine 26m at night; after 1-2 weeks increase to twice a day - continue donepezil - continue Keppra   ~~~~~~~~~~~~~~~~~~~~~~~~~~~~~~~~~~~~~~~~~~~~~~~~~~~~~~~~~~~~~~~~~  DR. PENUMALLI'S GUIDE TO HAPPY AND HEALTHY LIVING These are some of my general health and wellness recommendations. Some of them may apply to you better than others. Please use common sense as you try these suggestions and feel free to ask me any questions.   ACTIVITY/FITNESS Mental, social, emotional and physical stimulation are very important for brain and body health. Try learning a new activity (arts, music, language, sports, games).  Keep moving your body to the best of your abilities. You can do this at home, inside or outside, the park, community center, gym or anywhere you like. Consider a physical therapist or personal trainer to get started. Consider the app Sworkit. Fitness trackers such as smart-watches, smart-phones or Fitbits can help as well.   NUTRITION Eat more plants: colorful vegetables, nuts, seeds and berries.  Eat less sugar, salt, preservatives and processed foods.  Avoid toxins such as cigarettes and alcohol.  Drink water when you are thirsty. Warm water with a slice of lemon is an excellent morning drink to start the day.  Consider these websites for more information The Nutrition Source (hhttps://www.henry-hernandez.biz/ [employer] Nutrition (wWindowBlog.ch   RELAXATION Consider practicing mindfulness meditation or other relaxation techniques such as deep breathing, prayer, yoga, tai chi, massage. See website  mindful.org or the apps Headspace or Calm to help get started.   SLEEP Try to get at least 7-8+ hours sleep per day. Regular exercise and reduced caffeine will help you sleep better. Practice good sleep hygeine techniques. See website sleep.org for more information.   PLANNING Prepare estate planning, living will, healthcare POA documents. Sometimes this is best planned with the help of an attorney. Theconversationproject.org and agingwithdignity.org are excellent resources.

## 2016-01-19 NOTE — Progress Notes (Signed)
[facility]  PATIENT: Carlos Ballard DOB: Mar 21, 1928  REFERRING CLINICIAN:  HISTORY FROM: patient and daughter REASON FOR VISIT: follow up   HISTORICAL  CHIEF COMPLAINT:  Chief Complaint  Patient presents with  . Dementia    rm 7, dgtr- Myra, MMSE 14  . Follow-up    annual    HISTORY OF PRESENT ILLNESS:   UPDATE 01/19/16: Since last visit, staring spells continue. No convulsions. Memory stable. Daughter takes care of cooking, shopping, bills. Pt can maintain personal hygiene, make bed, get bowl of ice cream. He is on his own at home when daughter is at work. Not driving.   UPDATE 01/08/15: Since last visit, tripped and fell in Sept 2015, leading to right ankle / fibula fracture; treated at [place] with a cast, no surgery. Otherwise stable. Having 2-5 staring spells per month, with mouth chewing, and possible seizure related. No grand mal or convulsive seizures.   UPDATE 12/16/13: Since last visit, doing well. No seizures. Memory loss slightly progressive. Daughter takes care of his meds, meals; patient still able to feed and dress himself. No wandering. No safety issues. No falls. Still on pradaxa. Living will is in place.  UPDATE 03/27/13: Since last visit, having one staring spell type seizure every 2-3 months. Last 2 events occurred in the middle the night, where patient's daughter found in the morning somewhat confused and complaining of sore muscles. No witnessed convulsions.  UPDATE 07/06/12:  Has had one witnessed seizure on 05/24/12 similar to previous per his daughter.  Tolerating Keppra 1561m BID (has to have brand). Denies lack of sleep, dehydration, fever or skipped doses.  No changes in memory loss.  Seen neurosurgeon feels shunt is functioning well.    UPDATE 04/23/12: Returns today after having 2 seizures on 03/28/12 and 04/03/12.  Daughter reports they are similar to his previous seizures except he was disoriented a little longer.  Denies lack  of sleep, dehydration, fever or skipped doses.  Denies any urinary symptoms, does have a history of kidney stones and urinary tract infections.  Taking Keppra 10070mBID, his daughter did give him an extra Keppra after each seizure.  Denies any neurologic symptoms.  No changes in memory loss.    UPDATE 12/13/11: No new complaints since last visit 02/01/11, states "everything is just fine".  He has not had any episodes of seizures and is tolerating Keppra well, no side effects.  Also tolerating Aricept well.  Denies any falls.  Continues to have urgency but not any worse and no incontinence.  No new stroke symptoms since his stroke in November 2010.  Continues on Pradaxa.  Short term memory remains poor but not worse.   PRIOR HPI: Patient returns today with his daughter for followup. I have followed him in this office for several years. He has a long history of epilepsy, for which he took Dilantin for many years, and more recently has been on Keppra. He tolerated Depakote poorly. He has had progressive memory problems going back about 5 years, and has been on Aricept. Earlier this year, he was diagnosed with normal pressure hydrocephalus on the basis of MRI findings and response to a high volume lumbar puncture. He was scheduled to have a VP shunt placed by Dr. PoAnnette Stablen 08/24/09, but developed acute LLE weakness on 08/20/09. Urgent MRI confirmed an acute stroke involving the subcortical white matter of the right precentral gyrus. At that point I placed him back on aspirin and advised that he  delay surgery. Had VP shunt 11/03/09, improvement in gait. Last visit February. Since then he had an echo, which was normal, and a Holter monitor which demonstrated paroxysmal atrial fibrillation.   REVIEW OF SYSTEMS: Full 14 system review of systems performed and negative except for: memory loss / seizure.  ALLERGIES: Allergies  Allergen Reactions  . Depakote [Divalproex Sodium] Swelling     low blood counts  . Phenytek  [Phenytoin Sodium Extended] Other (See Comments)    ineffective    HOME MEDICATIONS: Outpatient Prescriptions Prior to Visit  Medication Sig Dispense Refill  . dabigatran (PRADAXA) 150 MG CAPS Take 150 mg by mouth every 12 (twelve) hours.      Marland Kitchen diltiazem (CARDIZEM CD) 240 MG 24 hr capsule Take 240 mg by mouth daily.    Marland Kitchen donepezil (ARICEPT) 10 MG tablet Take 1 tablet (10 mg total) by mouth at bedtime. 90 tablet 4  . fexofenadine (ALLEGRA) 180 MG tablet Take 180 mg by mouth daily.    . finasteride (PROSCAR) 5 MG tablet Take 5 mg by mouth daily.      Marland Kitchen levETIRAcetam (KEPPRA) 750 MG tablet Take 2 tablets (1,500 mg total) by mouth 2 (two) times daily. 360 tablet 4  . pantoprazole (PROTONIX) 40 MG tablet Take 40 mg by mouth daily.      . valsartan (DIOVAN) 320 MG tablet Take 320 mg by mouth daily.       No facility-administered medications prior to visit.    PAST MEDICAL HISTORY: Past Medical History  Diagnosis Date  . Hyperlipidemia   . Stroke ([place])   . AAA (abdominal aortic aneurysm) ([facility])     Incisional Hernia  . Kidney stones   . BPH (benign prostatic hyperplasia)   . Seizure ([employer]) 12/01  . Cancer ([place])     colon  . Allergic rhinitis, cause unspecified   . Malaise and fatigue   . Frequency of urination and polyuria   . Obstructive hydrocephalus   . Lumbago   . Bell's palsy   . Atrial fibrillation ([place])   . Abdominal pain   . Dementia   . Hypertension   . Colon polyp     Malignant  . Melanoma ([place])     PAST SURGICAL HISTORY: Past Surgical History  Procedure Laterality Date  . Colonoscopy    . Cholecystectomy    . Nasal sinus surgery    . Colon surgery    . Polypectomy  11/2005  . Melanoma excision  07/2008    FAMILY HISTORY: Family History  Problem Relation Age of Onset  . Cancer Mother     ovarian cancer  . Hypertension Father     SOCIAL HISTORY:  Social History   Social History  . Marital Status: Widowed    Spouse Name: N/A  . Number of  Children: 1  . Years of Education: 12th   Occupational History  . Retired     Lawyer   Social History Main Topics  . Smoking status: Former Smoker -- 40 years    Types: Cigars    Quit date: 06/20/2009  . Smokeless tobacco: Never Used  . Alcohol Use: No  . Drug Use: No  . Sexual Activity: Not on file   Other Topics Concern  . Not on file   Social History Narrative   Pt lives at home with his daughter, [name].   Caffeine Use: 1/2 caff coffee- 1/2- 1 pot daily in winter     PHYSICAL EXAM  Filed Vitals:  01/19/16 1258  BP: 124/82  Pulse: 75  Height: 5' 8"  (1.727 m)  Weight: 207 lb (93.895 kg)    Not recorded      Body mass index is 31.48 kg/(m^2).  MMSE - Mini Mental State Exam 01/19/2016 01/08/2015  Orientation to time 1 2  Orientation to Place 3 4  Registration 3 3  Attention/ Calculation 1 1  Recall 0 0  Language- name 2 objects 2 2  Language- repeat 0 0  Language- follow 3 step command 3 3  Language- read & follow direction 1 1  Write a sentence 0 0  Copy design 0 0  Total score 14 16     EXAM: General: Patient is well developed and well groomed. Patient is awake and alert and in no acute distress. CONJUNCTIVAL ERYTHEMA. Musculoskeletal: STOOPED POSTURE Skin: No rash, no bruising  Neurologic Exam  Mental Status: Awake and alert. Speech is fluent; comprehension is intact. JOVIAL. NEGATIVE SNOUT; BORDERLINE MYERSONS. Cranial Nerves: Pupils are equal and round and reactive to light. Conjugate eye movements are full and symmetric. Visual fields are full to confrontation. No evidence of papilledema. Facial sensation and facial muscle strength are symmetric and in normal limits. Hearing is DECR. Palate elevated symmetrically and uvula is midline. Shoulder shrug is symmetric. Tongue is midline. Motor: Symmetric normal motor tone is noted throughout. Normal muscle bulk. Testing reveals 5/5  muscle strength of the upper extremity, and 5/5 for the lower extremity.  Full ROM in upper and lower extremities. ROM of the spine is not restricted. Fine motor movements are normal in both hands. Sensory: Intact and symmetric to light touch. Coordination: FTN NORMAL; FINGER TAPPING AND FOOT TAPPING SYMM AND SMOOTH Gait and Station: SHORT STEPS, STOOPED POSTURE. Reflexes: DTR in the upper and lower extremity are present and symmetric 2+.     DIAGNOSTIC DATA (LABS, IMAGING, TESTING) - I reviewed patient records, labs, notes, testing and imaging myself where available.  Lab Results  Component Value Date   WBC 5.3 10/30/2009   HGB 15.4 10/30/2009   HCT 44.1 10/30/2009   MCV 97.5 10/30/2009   PLT 175 10/30/2009      Component Value Date/Time   NA 139 10/30/2009 1011   K 4.2 10/30/2009 1011   CL 103 10/30/2009 1011   CO2 26 10/30/2009 1011   GLUCOSE 109* 10/30/2009 1011   BUN 14 10/30/2009 1011   CREATININE 0.85 10/30/2009 1011   CALCIUM 9.4 10/30/2009 1011   GFRNONAA >60 10/30/2009 1011   GFRAA  10/30/2009 1011    >60        The eGFR has been calculated using the MDRD equation. This calculation has not been validated in all clinical situations. eGFR's persistently <60 mL/min signify possible Chronic Kidney Disease.   No results found for: CHOL No results found for: HGBA1C No results found for: VITAMINB12 No results found for: TSH  04/24/2012 MRI brain - shows diffuse pachymeningeal enhancement.  May be due to intracranial hypotension associated with ventricular shunt. Focal T1 hyperintense, ependymal lesion in the left anterior frontal horn, may be fatty or proteinaceous cyst.     ASSESSMENT AND PLAN  82 y.o. male with dementia, NPH and seizure disorder.   Has staring spells, possible complex partial seizures.  On Keppra 1567m BID. No witnessed convulsions in several years. Therefore will continue current Keppra only.   Memory loss slightly progressing on donepezil. Will add memantine.   Dx: seizure disorder + moderate dementia +  NPH  Moderate dementia  without behavioral disturbance  NPH (normal pressure hydrocephalus)  Seizure disorder ([place])    Plan: 1. Continue LEV 15100m BID (brand medically necessary) 2. Continue donepezil 118mdaily 3. Add memantine 1066mID 4. Caution with fall risk, unsupervised time, and progressive dementia  Meds ordered this encounter  Medications  . memantine (NAMENDA) 10 MG tablet    Sig: Take 1 tablet (10 mg total) by mouth 2 (two) times daily.    Dispense:  60 tablet    Refill:  12  . levETIRAcetam (KEPPRA) 750 MG tablet    Sig: Take 2 tablets (1,500 mg total) by mouth 2 (two) times daily.    Dispense:  360 tablet    Refill:  4    Brand medically necessary  . donepezil (ARICEPT) 10 MG tablet    Sig: Take 1 tablet (10 mg total) by mouth at bedtime.    Dispense:  90 tablet    Refill:  4   Return in about 1 year (around 01/18/2017).     VIKPenni BombardD 4/40/06/3817:22:99 Certified in Neurology, Neurophysiology and Neuroimaging  [facility] [id number]uiLewiseSheltonC [phone]

## 2017-01-18 ENCOUNTER — Ambulatory Visit: Payer: Medicare Other | Admitting: Diagnostic Neuroimaging

## 2017-01-19 ENCOUNTER — Encounter: Payer: Self-pay | Admitting: Diagnostic Neuroimaging

## 2017-01-28 ENCOUNTER — Other Ambulatory Visit: Payer: Self-pay | Admitting: Diagnostic Neuroimaging

## 2017-01-31 ENCOUNTER — Telehealth: Payer: Self-pay | Admitting: Diagnostic Neuroimaging

## 2017-01-31 NOTE — Telephone Encounter (Signed)
error 

## 2017-02-14 ENCOUNTER — Encounter (HOSPITAL_COMMUNITY): Payer: Self-pay | Admitting: General Practice

## 2017-02-14 DIAGNOSIS — I639 Cerebral infarction, unspecified: Secondary | ICD-10-CM

## 2017-02-14 HISTORY — DX: Cerebral infarction, unspecified: I63.9

## 2017-02-15 ENCOUNTER — Inpatient Hospital Stay (HOSPITAL_COMMUNITY)
Admission: EM | Admit: 2017-02-15 | Discharge: 2017-02-16 | DRG: 065 | Disposition: A | Discharge status: Home Health | Payer: Medicare Other | Source: Other Acute Inpatient Hospital | Attending: Internal Medicine | Admitting: Internal Medicine

## 2017-02-15 ENCOUNTER — Encounter (HOSPITAL_COMMUNITY): Payer: Self-pay | Admitting: *Deleted

## 2017-02-15 ENCOUNTER — Inpatient Hospital Stay (HOSPITAL_COMMUNITY)
Admission: EM | Admit: 2017-02-15 | Discharge: 2017-02-15 | Disposition: A | Discharge status: Home / Self Care | Payer: Medicare Other | Source: Other Acute Inpatient Hospital | Attending: Internal Medicine | Admitting: Internal Medicine

## 2017-02-15 ENCOUNTER — Inpatient Hospital Stay (HOSPITAL_COMMUNITY): Payer: Medicare Other

## 2017-02-15 DIAGNOSIS — G451 Carotid artery syndrome (hemispheric): Secondary | ICD-10-CM

## 2017-02-15 DIAGNOSIS — N183 Chronic kidney disease, stage 3 (moderate): Secondary | ICD-10-CM | POA: Diagnosis present

## 2017-02-15 DIAGNOSIS — Z7901 Long term (current) use of anticoagulants: Secondary | ICD-10-CM

## 2017-02-15 DIAGNOSIS — R531 Weakness: Secondary | ICD-10-CM

## 2017-02-15 DIAGNOSIS — I4891 Unspecified atrial fibrillation: Secondary | ICD-10-CM | POA: Diagnosis present

## 2017-02-15 DIAGNOSIS — I10 Essential (primary) hypertension: Secondary | ICD-10-CM | POA: Diagnosis not present

## 2017-02-15 DIAGNOSIS — H9190 Unspecified hearing loss, unspecified ear: Secondary | ICD-10-CM | POA: Diagnosis present

## 2017-02-15 DIAGNOSIS — Z85820 Personal history of malignant melanoma of skin: Secondary | ICD-10-CM | POA: Diagnosis not present

## 2017-02-15 DIAGNOSIS — Z8249 Family history of ischemic heart disease and other diseases of the circulatory system: Secondary | ICD-10-CM

## 2017-02-15 DIAGNOSIS — I482 Chronic atrial fibrillation: Secondary | ICD-10-CM

## 2017-02-15 DIAGNOSIS — I129 Hypertensive chronic kidney disease with stage 1 through stage 4 chronic kidney disease, or unspecified chronic kidney disease: Secondary | ICD-10-CM | POA: Diagnosis present

## 2017-02-15 DIAGNOSIS — I639 Cerebral infarction, unspecified: Principal | ICD-10-CM | POA: Diagnosis present

## 2017-02-15 DIAGNOSIS — R29898 Other symptoms and signs involving the musculoskeletal system: Secondary | ICD-10-CM

## 2017-02-15 DIAGNOSIS — G459 Transient cerebral ischemic attack, unspecified: Secondary | ICD-10-CM | POA: Diagnosis present

## 2017-02-15 DIAGNOSIS — F0280 Dementia in other diseases classified elsewhere without behavioral disturbance: Secondary | ICD-10-CM | POA: Diagnosis present

## 2017-02-15 DIAGNOSIS — Z8041 Family history of malignant neoplasm of ovary: Secondary | ICD-10-CM | POA: Diagnosis not present

## 2017-02-15 DIAGNOSIS — N400 Enlarged prostate without lower urinary tract symptoms: Secondary | ICD-10-CM | POA: Diagnosis present

## 2017-02-15 DIAGNOSIS — Z982 Presence of cerebrospinal fluid drainage device: Secondary | ICD-10-CM

## 2017-02-15 DIAGNOSIS — K219 Gastro-esophageal reflux disease without esophagitis: Secondary | ICD-10-CM | POA: Diagnosis present

## 2017-02-15 DIAGNOSIS — Z85038 Personal history of other malignant neoplasm of large intestine: Secondary | ICD-10-CM

## 2017-02-15 DIAGNOSIS — G9389 Other specified disorders of brain: Secondary | ICD-10-CM | POA: Diagnosis present

## 2017-02-15 DIAGNOSIS — G912 (Idiopathic) normal pressure hydrocephalus: Secondary | ICD-10-CM | POA: Diagnosis present

## 2017-02-15 DIAGNOSIS — E785 Hyperlipidemia, unspecified: Secondary | ICD-10-CM | POA: Diagnosis present

## 2017-02-15 DIAGNOSIS — Z87442 Personal history of urinary calculi: Secondary | ICD-10-CM

## 2017-02-15 DIAGNOSIS — Z86010 Personal history of colonic polyps: Secondary | ICD-10-CM | POA: Diagnosis not present

## 2017-02-15 DIAGNOSIS — Z888 Allergy status to other drugs, medicaments and biological substances status: Secondary | ICD-10-CM

## 2017-02-15 DIAGNOSIS — I714 Abdominal aortic aneurysm, without rupture: Secondary | ICD-10-CM | POA: Diagnosis present

## 2017-02-15 DIAGNOSIS — R9401 Abnormal electroencephalogram [EEG]: Secondary | ICD-10-CM | POA: Diagnosis present

## 2017-02-15 DIAGNOSIS — F0390 Unspecified dementia without behavioral disturbance: Secondary | ICD-10-CM | POA: Diagnosis present

## 2017-02-15 DIAGNOSIS — G40909 Epilepsy, unspecified, not intractable, without status epilepticus: Secondary | ICD-10-CM | POA: Diagnosis not present

## 2017-02-15 DIAGNOSIS — Z87891 Personal history of nicotine dependence: Secondary | ICD-10-CM | POA: Diagnosis not present

## 2017-02-15 DIAGNOSIS — G8191 Hemiplegia, unspecified affecting right dominant side: Secondary | ICD-10-CM | POA: Diagnosis present

## 2017-02-15 DIAGNOSIS — Z79899 Other long term (current) drug therapy: Secondary | ICD-10-CM

## 2017-02-15 LAB — COMPREHENSIVE METABOLIC PANEL
ALT: 21 U/L (ref 17–63)
ANION GAP: 9 (ref 5–15)
AST: 22 U/L (ref 15–41)
Albumin: 3.7 g/dL (ref 3.5–5.0)
Alkaline Phosphatase: 55 U/L (ref 38–126)
BUN: 12 mg/dL (ref 6–20)
CALCIUM: 9.4 mg/dL (ref 8.9–10.3)
CO2: 25 mmol/L (ref 22–32)
Chloride: 107 mmol/L (ref 101–111)
Creatinine, Ser: 1.26 mg/dL — ABNORMAL HIGH (ref 0.61–1.24)
GFR calc Af Amer: 57 mL/min — ABNORMAL LOW (ref >60)
GFR, EST NON AFRICAN AMERICAN: 49 mL/min — AB (ref >60)
GLUCOSE: 113 mg/dL — AB (ref 65–99)
POTASSIUM: 3.9 mmol/L (ref 3.5–5.1)
SODIUM: 141 mmol/L (ref 135–145)
Total Bilirubin: 1 mg/dL (ref 0.3–1.2)
Total Protein: 6.3 g/dL — ABNORMAL LOW (ref 6.5–8.1)

## 2017-02-15 LAB — LIPID PANEL
Cholesterol: 151 mg/dL (ref 0–200)
HDL: 32 mg/dL — AB (ref >40)
LDL Cholesterol: 60 mg/dL (ref 0–99)
TRIGLYCERIDES: 296 mg/dL — AB (ref <150)
Total CHOL/HDL Ratio: 4.7 RATIO
VLDL: 59 mg/dL — ABNORMAL HIGH (ref 0–40)

## 2017-02-15 MED ORDER — IRBESARTAN 300 MG PO TABS
300.0000 mg | ORAL_TABLET | Freq: Every day | ORAL | Status: DC
  Administered 2017-02-15 – 2017-02-16 (×2): 300 mg via ORAL
  Filled 2017-02-15 (×2): qty 1

## 2017-02-15 MED ORDER — LEVETIRACETAM 750 MG PO TABS
1500.0000 mg | ORAL_TABLET | Freq: Two times a day (BID) | ORAL | Status: DC
  Administered 2017-02-15 – 2017-02-16 (×3): 1500 mg via ORAL
  Filled 2017-02-15 (×4): qty 2

## 2017-02-15 MED ORDER — DABIGATRAN ETEXILATE MESYLATE 150 MG PO CAPS
150.0000 mg | ORAL_CAPSULE | Freq: Two times a day (BID) | ORAL | Status: DC
  Administered 2017-02-15 – 2017-02-16 (×2): 150 mg via ORAL
  Filled 2017-02-15 (×3): qty 1

## 2017-02-15 MED ORDER — ZOLPIDEM TARTRATE 5 MG PO TABS: 5.0000 mg | ORAL_TABLET | Freq: Every evening | ORAL | Status: DC | PRN

## 2017-02-15 MED ORDER — SENNOSIDES-DOCUSATE SODIUM 8.6-50 MG PO TABS: 1.0000 | ORAL_TABLET | Freq: Every evening | ORAL | Status: DC | PRN

## 2017-02-15 MED ORDER — ONDANSETRON HCL 4 MG/2ML IJ SOLN: 4.0000 mg | Freq: Three times a day (TID) | INTRAMUSCULAR | Status: DC | PRN

## 2017-02-15 MED ORDER — ACETAMINOPHEN 650 MG RE SUPP: 650.0000 mg | RECTAL | Status: DC | PRN

## 2017-02-15 MED ORDER — DILTIAZEM HCL ER COATED BEADS 240 MG PO CP24
240.0000 mg | ORAL_CAPSULE | Freq: Every day | ORAL | Status: DC
  Administered 2017-02-15 – 2017-02-16 (×2): 240 mg via ORAL
  Filled 2017-02-15 (×2): qty 1

## 2017-02-15 MED ORDER — STROKE: EARLY STAGES OF RECOVERY BOOK
Freq: Once | Status: AC
  Administered 2017-02-15: 1

## 2017-02-15 MED ORDER — LORATADINE 10 MG PO TABS
10.0000 mg | ORAL_TABLET | Freq: Every day | ORAL | Status: DC
  Administered 2017-02-15 – 2017-02-16 (×2): 10 mg via ORAL
  Filled 2017-02-15 (×2): qty 1

## 2017-02-15 MED ORDER — SODIUM CHLORIDE 0.9 % IV SOLN
INTRAVENOUS | Status: DC
  Administered 2017-02-15: 1000 mL via INTRAVENOUS

## 2017-02-15 MED ORDER — PANTOPRAZOLE SODIUM 40 MG PO TBEC
40.0000 mg | DELAYED_RELEASE_TABLET | Freq: Every day | ORAL | Status: DC
  Administered 2017-02-15 – 2017-02-16 (×2): 40 mg via ORAL
  Filled 2017-02-15 (×2): qty 1

## 2017-02-15 MED ORDER — FINASTERIDE 5 MG PO TABS
5.0000 mg | ORAL_TABLET | Freq: Every day | ORAL | Status: DC
  Administered 2017-02-15 – 2017-02-16 (×2): 5 mg via ORAL
  Filled 2017-02-15 (×2): qty 1

## 2017-02-15 MED ORDER — ACETAMINOPHEN 160 MG/5ML PO SOLN: 650.0000 mg | ORAL | Status: DC | PRN

## 2017-02-15 MED ORDER — ACETAMINOPHEN 325 MG PO TABS
650.0000 mg | ORAL_TABLET | ORAL | Status: DC | PRN
Start: 2017-02-15 — End: 2017-02-16

## 2017-02-15 MED ORDER — DONEPEZIL HCL 10 MG PO TABS
10.0000 mg | ORAL_TABLET | Freq: Every day | ORAL | Status: DC
  Administered 2017-02-15 (×2): 10 mg via ORAL
  Filled 2017-02-15 (×2): qty 1

## 2017-02-15 MED ORDER — DABIGATRAN ETEXILATE MESYLATE 150 MG PO CAPS: 150.0000 mg | ORAL_CAPSULE | Freq: Every day | ORAL | Status: DC

## 2017-02-15 NOTE — Progress Notes (Signed)
  Speech Language Pathology  Patient Details Name: Nachman Sundt MRN: [id number] DOB: December 30, 1927 Today's Date: 02/15/2017 Time: 1038-     Orders received for speech-language-cognitive assessment. Pt has history of dementia and is not responsible for financial management or medicine. Daughter reported he is "back to his normal self." Daughter lives with pt and assists with above mentioned responsibilities.  [place], Chrishaun Sasso Willis 02/15/2017, 12:15 PM  Orbie Pyo Colvin Caroli.Ed [employer] [phone]

## 2017-02-15 NOTE — H&P (Signed)
History and Physical    Carlos Ballard OFB:[id number] DOB: 20-Jan-1928 DOA: 02/15/2017  Referring MD/NP/PA:   PCP: Carlos Chroman, MD   Patient coming from:  The patient is coming from home.  At baseline, pt is partially dependent for most of ADL.   Chief Complaint: Right-sided weakness  HPI: Carlos Ballard is a 83 y.o. male with medical history significant of seizure, stroke, obstructive hydrocephalus (s/p of VP shunt), melanoma, dementia, colon cancer, atrial fibrillation on Pradaxa, AAA, hypertension, hyperlipidemia, GERD, who presents with right-sided weakness.  Per patient's daughter, patient was last known normal at about 2 PM. He developed R-sided weakness at about 7 PM. Patient dose not have slurred speech, facial droop, vision loss or hearing loss. Per his daughter, patient had another episode of right leg weakness 10 days ago, and again right leg weakness on Friday, which resolved spontaneously. Patient does not have chest pain, shortness breath, GI symptoms, symptoms of UTI. No fever or chills.  Pt was initially evaluated in the ED of [facility]. Neurology was consulted, recommended to get CTA of neck and head, which could not done in [facility], therefore pt is transferred to [facility].  Patient was found to have a WBC 6.7, INR 1.2, PTT 34.8, creatinine 1.34 which was 0.84 on 10/30/09.   When saw pt on the floor. He is asymptomatic. His right-sided weakness has resolved. No facial droop, slurred speech, vision change or hearing loss. Of note, pt is on Pradaxa for atrial fibrillation. When she was initially started on Pradaxa, he had hematuria and GI bleeding. His pradaxa dose was reduced 150 mg daily, then his bleeding stopped.  CT-head in [facility] showed 1. No acute intracranial abnormalities 2. Right parietal approach ventriculostomy catheter stable in position. Stable ventricular size 3. right parietal region encephalomalacia is stable  from previous MRI 4. Bilateral subdural collection are stable from prior MRI, possibly due to chronic shunting, no acute hemorrhage identified  Review of Systems:   General: no fevers, chills, no changes in body weight, has poor appetite, has fatigue HEENT: no blurry vision, hearing changes or sore throat Respiratory: no dyspnea, coughing, wheezing CV: no chest pain, no palpitations GI: no nausea, vomiting, abdominal pain, diarrhea, constipation GU: no dysuria, burning on urination, increased urinary frequency, hematuria  Ext: no leg edema Neuro: had right-sided weakness. No vision change or hearing loss Skin: no rash, no skin tear. MSK: No muscle spasm, no deformity, no limitation of range of movement in spin Heme: No easy bruising.  Travel history: No recent long distant travel.  Allergy:  Allergies  Allergen Reactions  . Depakote [Divalproex Sodium] Swelling     low blood counts  . Phenytek [Phenytoin Sodium Extended] Other (See Comments)    ineffective    Past Medical History:  Diagnosis Date  . AAA (abdominal aortic aneurysm) (HCC)    Incisional Hernia  . Abdominal pain   . Allergic rhinitis, cause unspecified   . Atrial fibrillation ([place])   . Bell's palsy   . BPH (benign prostatic hyperplasia)   . Cancer ([place])    colon  . Colon polyp    Malignant  . Dementia   . Frequency of urination and polyuria   . Hyperlipidemia   . Hypertension   . Kidney stones   . Lumbago   . Malaise and fatigue   . Melanoma ([place])   . Obstructive hydrocephalus   . Seizure ([place]) 12/01  . Stroke [facility])  Past Surgical History:  Procedure Laterality Date  . CHOLECYSTECTOMY    . COLON SURGERY    . COLONOSCOPY    . MELANOMA EXCISION  07/2008  . NASAL SINUS SURGERY    . POLYPECTOMY  11/2005    Social History:  reports that he quit smoking about 7 years ago. His smoking use included Cigars. He quit after 40.00 years of use. He has never used smokeless tobacco. He reports that he  does not drink alcohol or use drugs.  Family History:  Family History  Problem Relation Age of Onset  . Cancer Mother     ovarian cancer  . Hypertension Father      Prior to Admission medications   Medication Sig Start Date End Date Taking? Authorizing Provider  dabigatran (PRADAXA) 150 MG CAPS Take 150 mg by mouth every 12 (twelve) hours.      Historical Provider, MD  diltiazem (CARDIZEM CD) 240 MG 24 hr capsule Take 240 mg by mouth daily. 03/21/13   Historical Provider, MD  donepezil (ARICEPT) 10 MG tablet Take 1 tablet (10 mg total) by mouth at bedtime. 01/19/16   Penni Bombard, MD  fexofenadine (ALLEGRA) 180 MG tablet Take 180 mg by mouth daily.    Historical Provider, MD  finasteride (PROSCAR) 5 MG tablet Take 5 mg by mouth daily.      Historical Provider, MD  KEPPRA 750 MG tablet TAKE 2 TABLETS BY MOUTH TWO TIMES DAILY 01/30/17   Penni Bombard, MD  memantine (NAMENDA) 10 MG tablet Take 1 tablet (10 mg total) by mouth 2 (two) times daily. 01/19/16   Penni Bombard, MD  pantoprazole (PROTONIX) 40 MG tablet Take 40 mg by mouth daily.      Historical Provider, MD  valsartan (DIOVAN) 320 MG tablet Take 320 mg by mouth daily.      Historical Provider, MD    Physical Exam: Vitals:   02/15/17 0030 02/15/17 0230  BP: (!) 172/108 (!) 136/104  Pulse: (!) 56 68  Resp: 18 16  Temp: 97.7 F (36.5 C) 97.9 F (36.6 C)  TempSrc: Oral Oral  SpO2: 94% 97%  Weight: 91.8 kg (202 lb 6.1 oz)   Height: 5\' 8"  (1.727 m)    General: Not in acute distress HEENT:       Eyes: PERRL, EOMI, no scleral icterus.       ENT: No discharge from the ears and nose, no pharynx injection, no tonsillar enlargement.        Neck: No JVD, no bruit, no mass felt. Heme: No neck lymph node enlargement. Cardiac: S1/S2, RRR, No murmurs, No gallops or rubs. Respiratory:  No rales, wheezing, rhonchi or rubs. GI: Soft, nondistended, nontender, no rebound pain, no organomegaly, BS present. GU: No hematuria Ext:  No pitting leg edema bilaterally. 2+DP/PT pulse bilaterally. Musculoskeletal: No joint deformities, No joint redness or warmth, no limitation of ROM in spin. Skin: No rashes.  Neuro: Alert, oriented to person and place, but not know year, cranial nerves II-XII grossly intact, moves all extremities normally. Muscle strength 5/5 in all extremities, sensation to light touch intact. Brachial reflex 2+ bilaterally Negative Babinski's sign. Normal finger to nose test. Psych: Patient is not psychotic.  Labs on Admission: I have personally reviewed following labs and imaging studies  CBC: No results for input(s): WBC, NEUTROABS, HGB, HCT, MCV, PLT in the last 168 hours. Basic Metabolic Panel: No results for input(s): NA, K, CL, CO2, GLUCOSE, BUN, CREATININE, CALCIUM, MG, PHOS in  the last 168 hours. GFR: CrCl cannot be calculated (Patient's most recent lab result is older than the maximum 21 days allowed.). Liver Function Tests: No results for input(s): AST, ALT, ALKPHOS, BILITOT, PROT, ALBUMIN in the last 168 hours. No results for input(s): LIPASE, AMYLASE in the last 168 hours. No results for input(s): AMMONIA in the last 168 hours. Coagulation Profile: No results for input(s): INR, PROTIME in the last 168 hours. Cardiac Enzymes: No results for input(s): CKTOTAL, CKMB, CKMBINDEX, TROPONINI in the last 168 hours. BNP (last 3 results) No results for input(s): PROBNP in the last 8760 hours. HbA1C: No results for input(s): HGBA1C in the last 72 hours. CBG: No results for input(s): GLUCAP in the last 168 hours. Lipid Profile: No results for input(s): CHOL, HDL, LDLCALC, TRIG, CHOLHDL, LDLDIRECT in the last 72 hours. Thyroid Function Tests: No results for input(s): TSH, T4TOTAL, FREET4, T3FREE, THYROIDAB in the last 72 hours. Anemia Panel: No results for input(s): VITAMINB12, FOLATE, FERRITIN, TIBC, IRON, RETICCTPCT in the last 72 hours. Urine analysis: No results found for: COLORURINE,  APPEARANCEUR, LABSPEC, PHURINE, GLUCOSEU, HGBUR, BILIRUBINUR, KETONESUR, PROTEINUR, UROBILINOGEN, NITRITE, LEUKOCYTESUR Sepsis Labs: @LABRCNTIP (procalcitonin:4,lacticidven:4) )No results found for this or any previous visit (from the past 240 hour(s)).   Radiological Exams on Admission: No results found.   EKG: Independently reviewed.  Not done in ED, will get one.   Assessment/Plan Principal Problem:   Right sided weakness Active Problems:   NPH (normal pressure hydrocephalus)   Seizure disorder (HCC)   Atrial fibrillation (HCC)   Stroke (HCC)   Moderate dementia without behavioral disturbance   TIA (transient ischemic attack)   GERD (gastroesophageal reflux disease)   Essential hypertension   Right sided weakness and hx of stroke: Patient symptoms have resolved. likely due to TIA. CT head did not show acute intracranial abnormalities. Patient is s/p of VP shunt, not sure if pt can do MRI or nor. May need to discuss with his neurosurgeon, Dr. Trenton Gammon before ordering MRI of brain.  - will admit to tele bed as inpt -Atrial fibrillation: on pradaxa - Risk factor modification: HgbA1c, fasting lipid panel - will get CTA of head and neck - PT consult, OT consult - Bedside swallowing screen was ordered, will get speech consult in AM - 2 d Echocardiogram  - please call neurology in the morning  NPH (normal pressure hydrocephalus): s/p of VP shunt which is stable on CT-head - No acute issues  Seizure disorder [facility]): -Seizure precaution -Continue Keppra  Atrial Fibrillation: CHA2DS2-VASc Score is 5, needs oral anticoagulation. Patient is on pradaxa at home. Heart rate is well controlled. -continue pradaxa and cardizem  Moderate dementia without behavioral disturbance: -continue donepezil  GERD: -Protonix  HTN: - irbesartan, cardizem  AKI vs. CKD-III: Patient creatinine is 1.34. His creatinine was 0.85 on 10/30/09. Not sure if this is AKI or CKD-III. -f/u renal fx by  BMP   DVT ppx: on Pradaxa Code Status: Full code Family Communication:  Yes, patient's duaghter at bed side Disposition Plan:  Anticipate discharge back to previous home environment Consults called:  none Admission status:  Inpatient/tele   Date of Service 02/15/2017    Ivor Costa [facility] Pager [phone]  If 7PM-7AM, please contact night-coverage [email or web address] Password [email or web address] 02/15/2017, 3:21 AM

## 2017-02-15 NOTE — Progress Notes (Signed)
Patient became confused and violent when I came in to do 0430 neuro check and vitals he had pulled both IV's out and removed his telemetry daughter was in room and assisted with calming patient down. He has no short term memory.

## 2017-02-15 NOTE — Progress Notes (Signed)
[facility] PROGRESS NOTE  Carlos Ballard UYQ:[id number] DOB: 12/19/1927 DOA: 02/15/2017  PCP: Glenda Chroman, MD  Brief History/Interval Summary: 83 year old male with a past medical history of seizure disorder, stroke, obstructive hydrocephalus status post VP shunt, melanoma, dementia, atrial fibrillation on Primaxin, who presented with right-sided weakness. Patient had been having right leg weakness for about a week or so. And then on the day of admission, patient's daughter found him to have profound right upper and lower extremity weakness along with facial droop. EMS was called and the patient was taken to an outside facility from where he was transferred over to this facility for further management.  Reason for Visit: Right-sided weakness  Consultants: Neurology  Procedures:  Echocardiogram is pending  Carotid Dopplers pending  Antibiotics: None  Subjective/Interval History: Patient has dementia and is hard of hearing. So it was very difficult to obtain history from him. He denies any pain. His daughter is at the bedside.  ROS: Denies any nausea or vomiting  Objective:  Vital Signs  Vitals:   02/15/17 0230 02/15/17 0430 02/15/17 0630 02/15/17 0929  BP: (!) 136/104 (!) 148/75 (!) 145/78 128/89  Pulse: 68 95 (!) 101 81  Resp: 16 16 16 15   Temp: 97.9 F (36.6 C) 97.9 F (36.6 C) 97.9 F (36.6 C) 97.5 F (36.4 C)  TempSrc: Oral Oral Oral Oral  SpO2: 97% 99% 99% 99%  Weight:      Height:        Intake/Output Summary (Last 24 hours) at 02/15/17 1157 Last data filed at 02/15/17 0300  Gross per 24 hour  Intake             52.5 ml  Output                0 ml  Net             52.5 ml   Filed Weights   02/15/17 0030  Weight: 91.8 kg (202 lb 6.1 oz)    General appearance: alert, cooperative, appears stated age and distracted Resp: clear to auscultation bilaterally Cardio: regular rate and rhythm, S1, S2 normal, no murmur, click, rub or gallop GI:  soft, non-tender; bowel sounds normal; no masses,  no organomegaly Extremities: extremities normal, atraumatic, no cyanosis or edema Neurologic: Awake and alert. Confused at times. Hard of hearing. Subtle right lower extremity weakness appreciated. No pronator drift. Finger to nose is normal.  Lab Results:  Data Reviewed: I have personally reviewed following labs and imaging studies   Lipid Profile:  Recent Labs  02/15/17 0723  CHOL 151  HDL 32*  LDLCALC 60  TRIG 296*  CHOLHDL 4.7      Radiology Studies: No results found.   Medications:  Scheduled: . dabigatran  150 mg Oral Q12H  . diltiazem  240 mg Oral Daily  . donepezil  10 mg Oral QHS  . finasteride  5 mg Oral Daily  . irbesartan  300 mg Oral Daily  . levETIRAcetam  1,500 mg Oral BID  . loratadine  10 mg Oral Daily  . pantoprazole  40 mg Oral Daily   Continuous: . sodium chloride 1,000 mL (02/15/17 0218)   GLO:VFIEPPIRJJOAC **OR** acetaminophen (TYLENOL) oral liquid 160 mg/5 mL **OR** acetaminophen, ondansetron, senna-docusate, zolpidem  Assessment/Plan:  Principal Problem:   Right sided weakness Active Problems:   NPH (normal pressure hydrocephalus)   Seizure disorder (HCC)   Atrial fibrillation (HCC)   Stroke (HCC)   Moderate dementia without behavioral  disturbance   TIA (transient ischemic attack)   GERD (gastroesophageal reflux disease)   Essential hypertension    Right sided weakness Patient's symptoms had mostly resolved by the time he was transferred over to this facility. Discussed with neurology who will consult on the patient. Discussed with Dr. Saintclair Halsted with neurosurgery regarding patient's VP shunt and need to do MRI. He says to proceed with MRI. Patient may need shunt evaluation in the next couple of weeks, which can be arranged as an outpatient. MRI has been ordered. Echocardiogram, carotid Doppler. Patient is already anticoagulated for a history of atrial fibrillation. PT and OT  evaluation.  History of NPH (normal pressure hydrocephalus) S/p of VP shunt which is stable on CT-head.   History of Seizure disorder Followed by Dr. Leta Baptist. Continue Keppra. EEG.  History of Atrial Fibrillation CHA2DS2-VASc Score is 5. Patient is on pradaxa at home. Heart rate is well controlled. Continue pradaxa and cardizem  Moderate dementia without behavioral disturbance Continue donepezil  GERD Protonix  History of essential hypertension  Continue home medications. Blood pressure stable.  AKI vs. CKD-III Patient creatinine is 1.34. His creatinine was 0.85 on 10/30/09. Not sure if this is AKI or CKD-III. Recheck renal function tomorrow. Monitor urine output.  DVT Prophylaxis: On Pradaxa    Code Status: Full code  Family Communication: Discussed with the patient's daughter  Disposition Plan: Management as outlined above. Await stroke workup to be completed.    LOS: 0 days   [facility] Pager [phone] 02/15/2017, 11:57 AM  If 7PM-7AM, please contact night-coverage at [email or web address], password [facility]

## 2017-02-15 NOTE — Progress Notes (Signed)
Patient arrived around 0030 alert and oriented to self only, is back to his neurologic baseline, daughter in room overnight. Patient with base line dementia and short term memory is terrible.

## 2017-02-15 NOTE — Progress Notes (Signed)
OT Cancellation Note  Patient Details Name: Carlos Ballard MRN: [id number] DOB: 1928/09/11   Cancelled Treatment:    Reason Eval/Treat Not Completed: Patient at procedure or test/ unavailable/.  Lucille Passy, OTR/L [place], [place] 02/15/2017, 3:20 PM

## 2017-02-15 NOTE — Progress Notes (Signed)
PT Cancellation Note  Patient Details Name: Carlos Ballard MRN: [id number] DOB: 03/02/1928   Cancelled Treatment:    Reason Eval/Treat Not Completed: Medical issues which prohibited therapy. Pt currently with bed rest orders in place. PT will continue to f/u with pt as appropriate and available.    [place] 02/15/2017, 8:32 AM

## 2017-02-15 NOTE — Progress Notes (Signed)
PT Cancellation Note  Patient Details Name: Carlos Ballard MRN: [id number] DOB: 16-Jun-1928   Cancelled Treatment:    Reason Eval/Treat Not Completed: Patient at procedure or test/unavailable. Pt off of the floor for a procedure (EEG). PT will continue to f/u with pt as available.   [place] 02/15/2017, 3:51 PM

## 2017-02-15 NOTE — Progress Notes (Signed)
Patient continues to take of telemetry. Tele on standby

## 2017-02-15 NOTE — Consult Note (Signed)
NEURO HOSPITALIST CONSULT NOTE   Requestig physician: Dr. Maryland Pink   Reason for Consult: right sided weakness   History obtained from:  Chart  HPI:                                                                                                                                          Carlos Ballard is an 83 y.o. male with known dementia, NPH and seizure disorder. Patient was last seen 1 year ago by Dr. Leta Baptist for his dementia and NPH. In addition patient's past medical history includes melanoma, colon cancer, atrial fibrillation twitches on per DACs, AAA, hypertension, hyperlipidemia, and presenting to the.ED with right-sided weakness. Patient currently has a shunt placed for his NPH.  Per patient's daughter, patient was last known normal at about 2 PM. He developed R-sided weakness at about 7 PM. Patient dose not have slurred speech, facial droop, vision loss or hearing loss. Per his daughter, patient had another episode of right leg weakness 10 days ago, and again right leg weakness on Friday, which resolved spontaneously. She was initially evaluated in the ED at [facility]. At that time neurology was consult dated recommended to get a CTA of neck and head however this could not be done at [facility] in hospital and thus he was transferred to [facility]. Patient was found to have an INR of 1.2 and elevated creatinine of 1.34.  Currently patient is lying in bed comfortable, he can give no history and does not even know why he is in the hospital. He knows that his daughter brought him to the hospital but is unclear why he is actually at the hospital. He does not have any knowledge amend that he had any right-sided weakness.  Attempt was made to contact daughter however I was unable to get a hold of her.   Past Medical History:  Diagnosis Date  . AAA (abdominal aortic aneurysm) (HCC)    Incisional Hernia  . Abdominal pain   . Allergic rhinitis,  cause unspecified   . Atrial fibrillation ([place])   . Bell's palsy   . BPH (benign prostatic hyperplasia)   . Cancer ([place])    colon  . Colon polyp    Malignant  . Dementia   . Frequency of urination and polyuria   . Hyperlipidemia   . Hypertension   . Kidney stones   . Lumbago   . Malaise and fatigue   . Melanoma ([place])   . Obstructive hydrocephalus   . Seizure ([facility]) 12/01  . Stroke [facility])     Past Surgical History:  Procedure Laterality Date  . CHOLECYSTECTOMY    . COLON SURGERY    . COLONOSCOPY    . MELANOMA EXCISION  07/2008  . NASAL SINUS  SURGERY    . POLYPECTOMY  11/2005    Family History  Problem Relation Age of Onset  . Cancer Mother     ovarian cancer  . Hypertension Father       Social History:  reports that he quit smoking about 7 years ago. His smoking use included Cigars. He quit after 40.00 years of use. He has never used smokeless tobacco. He reports that he does not drink alcohol or use drugs.  Allergies  Allergen Reactions  . Depakote [Divalproex Sodium] Swelling     low blood counts  . Phenytek [Phenytoin Sodium Extended] Other (See Comments)    ineffective    MEDICATIONS:                                                                                                                     Prior to Admission:  Prescriptions Prior to Admission  Medication Sig Dispense Refill Last Dose  . dabigatran (PRADAXA) 150 MG CAPS Take 150 mg by mouth every 12 (twelve) hours.     Taking  . diltiazem (CARDIZEM CD) 240 MG 24 hr capsule Take 240 mg by mouth daily.   Taking  . donepezil (ARICEPT) 10 MG tablet Take 1 tablet (10 mg total) by mouth at bedtime. 90 tablet 4   . fexofenadine (ALLEGRA) 180 MG tablet Take 180 mg by mouth daily.   Taking  . finasteride (PROSCAR) 5 MG tablet Take 5 mg by mouth daily.     Taking  . KEPPRA 750 MG tablet TAKE 2 TABLETS BY MOUTH TWO TIMES DAILY 360 tablet 0   . memantine (NAMENDA) 10 MG tablet Take 1 tablet (10 mg total) by  mouth 2 (two) times daily. 60 tablet 12   . pantoprazole (PROTONIX) 40 MG tablet Take 40 mg by mouth daily.     Taking  . valsartan (DIOVAN) 320 MG tablet Take 320 mg by mouth daily.     Taking   Scheduled: . dabigatran  150 mg Oral Daily  . diltiazem  240 mg Oral Daily  . donepezil  10 mg Oral QHS  . finasteride  5 mg Oral Daily  . irbesartan  300 mg Oral Daily  . levETIRAcetam  1,500 mg Oral BID  . loratadine  10 mg Oral Daily  . pantoprazole  40 mg Oral Daily     ROS:  History obtained from the patient  General ROS: negative for - chills, fatigue, fever, night sweats, weight gain or weight loss Psychological ROS: negative for - behavioral disorder, hallucinations, memory difficulties, mood swings or suicidal ideation Ophthalmic ROS: negative for - blurry vision, double vision, eye pain or loss of vision ENT ROS: negative for - epistaxis, nasal discharge, oral lesions, sore throat, tinnitus or vertigo Allergy and Immunology ROS: negative for - hives or itchy/watery eyes Hematological and Lymphatic ROS: negative for - bleeding problems, bruising or swollen lymph nodes Endocrine ROS: negative for - galactorrhea, hair pattern changes, polydipsia/polyuria or temperature intolerance Respiratory ROS: negative for - cough, hemoptysis, shortness of breath or wheezing Cardiovascular ROS: negative for - chest pain, dyspnea on exertion, edema or irregular heartbeat Gastrointestinal ROS: negative for - abdominal pain, diarrhea, hematemesis, nausea/vomiting or stool incontinence Genito-Urinary ROS: negative for - dysuria, hematuria, incontinence or urinary frequency/urgency Musculoskeletal ROS: negative for - joint swelling or muscular weakness Neurological ROS: as noted in HPI Dermatological ROS: negative for rash and skin lesion changes   Blood pressure (!)  145/78, pulse (!) 101, temperature 97.9 F (36.6 C), temperature source Oral, resp. rate 16, height 5\' 8"  (1.727 m), weight 91.8 kg (202 lb 6.1 oz), SpO2 99 %.   Neurologic Examination:                                                                                                      HEENT-  Normocephalic, no lesions, without obvious abnormality.  Normal external eye and conjunctiva.  Normal TM's bilaterally.  Normal auditory canals and external ears. Normal external nose, mucus membranes and septum.  Normal pharynx. Cardiovascular- irregularly irregular rhythm, pulses palpable throughout   Lungs- chest clear, no wheezing, rales, normal symmetric air entry Abdomen- normal findings: bowel sounds normal Extremities- no edema Lymph-no adenopathy palpable Musculoskeletal-no joint tenderness, deformity or swelling Skin-warm and dry, no hyperpigmentation, vitiligo, or suspicious lesions  Neurological Examination Mental Status: Alert, he is not oriented, has no idea why he is in the hospital, he is able to follow simple commands but not three-step commands. He is very hard of hearing thus at times it takes multiple attempts to get him to follow commands but once he understands he has no difficulty. Cranial Nerves: II:  Visual fields grossly normal, pupils equal, round, reactive to light and accommodation III,IV, VI: ptosis not present, extra-ocular motions intact bilaterally V,VII: smile symmetric, facial light touch sensation normal bilaterally VIII: hearing normal bilaterally IX,X: uvula rises symmetrically XI: bilateral shoulder shrug XII: midline tongue extension Motor: Right : Upper extremity   5/5    Left:     Upper extremity   5/5  Lower extremity   5/5     Lower extremity   5/5 Tone and bulk:normal tone throughout; no atrophy noted Sensory: Pinprick and light touch intact throughout, bilaterally Deep Tendon Reflexes: 2+ and symmetric throughout with the exception of no ankle  jerks Plantars: Mute bilaterally Cerebellar: normal finger-to-nose, normal rapid alternating movements and normal heel-to-shin test Gait: Not tested    Lab Results: Basic Metabolic Panel:  No results for input(s): NA, K, CL, CO2, GLUCOSE, BUN, CREATININE, CALCIUM, MG, PHOS in the last 168 hours.  Liver Function Tests: No results for input(s): AST, ALT, ALKPHOS, BILITOT, PROT, ALBUMIN in the last 168 hours. No results for input(s): LIPASE, AMYLASE in the last 168 hours. No results for input(s): AMMONIA in the last 168 hours.  CBC: No results for input(s): WBC, NEUTROABS, HGB, HCT, MCV, PLT in the last 168 hours.  Cardiac Enzymes: No results for input(s): CKTOTAL, CKMB, CKMBINDEX, TROPONINI in the last 168 hours.  Lipid Panel:  Recent Labs Lab 02/15/17 0723  CHOL 151  TRIG 296*  HDL 32*  CHOLHDL 4.7  VLDL 59*  LDLCALC 60    CBG: No results for input(s): GLUCAP in the last 168 hours.  Microbiology: Results for orders placed or performed during the hospital encounter of 04/24/09  CSF cell count with differential     Status: Abnormal   Collection Time: 04/24/09 11:00 AM  Result Value Ref Range Status   Tube # 1  Final   Color, CSF COLORLESS COLORLESS Final   Appearance, CSF CLEAR CLEAR Final   Supernatant NOT INDICATED  Final   RBC Count, CSF 129 (H) 0 /cu mm Final   WBC, CSF 2 0 - 5 /cu mm Final   Segmented Neutrophils-CSF RARE 0 - 6 % Final   Lymphs, CSF FEW 40 - 80 % Final   Monocyte-Macrophage-Spinal Fluid RARE 15 - 45 % Final  Protein and glucose, CSF     Status: Abnormal   Collection Time: 04/24/09 11:00 AM  Result Value Ref Range Status   Glucose, CSF 62 43 - 76 mg/dL Final   Total  Protein, CSF 54 (H) 15 - 45 mg/dL Final  [employer](R) IBB/CW88, CSF     Status: None   Collection Time: 04/24/09 11:00 AM  Result Value Ref Range Status   QBV/QX45 Analysis, CSF SEE SEPARATE REPORT [employer]  Final    Coagulation Studies: No results for  input(s): LABPROT, INR in the last 72 hours.  Imaging: No results found.     Assessment and plan per attending neurologist  Etta Quill PA-C [facility] [phone]  02/15/2017, 9:19 AM   Assessment/Plan: This is an 83 year old male with past significant history of seizure, dementia, obstructive hydrocephalus status post VP shunt, atrial fibrillation on Permax who 10 days ago had a right leg weakness and recently developed transient right arm weakness. Given his history of atrial fibrillation cannot rule out stroke however due to the fact the patient has a VP shunt he will need to have a MRI with a shunt manufacturer representative in order to re-adjust a shunt settings post MRI. At the same time I would also recommend that the representative evaluate shunts functioning. If MRI is positive for stroke patient will need further stroke workup.

## 2017-02-15 NOTE — Progress Notes (Signed)
Routine adult EEG completed, results pending. 

## 2017-02-15 NOTE — Procedures (Signed)
ELECTROENCEPHALOGRAM REPORT  Date of Study: 02/15/2017  Patient's Name: Carlos Ballard MRN: [id number] Date of Birth: 06-30-1928  Referring Provider: Dr. Bonnielee Haff  Clinical History: This is an 83 year old man with dementia and NPH, admitted with right-sided weakness.  Medications: levETIRAcetam (KEPPRA) tablet 1,500 mg  acetaminophen (TYLENOL) tablet 650 mg  dabigatran (PRADAXA) capsule 150 mg  diltiazem (CARDIZEM CD) 24 hr capsule 240 mg  donepezil (ARICEPT) tablet 10 mg  finasteride (PROSCAR) tablet 5 mg  irbesartan (AVAPRO) tablet 300 mg  loratadine (CLARITIN) tablet 10 mg  ondansetron (ZOFRAN) injection 4 mg  pantoprazole (PROTONIX) EC tablet 40 mg  senna-docusate (Senokot-S) tablet 1 tablet  zolpidem (AMBIEN) tablet 5 mg   Technical Summary: A multichannel digital EEG recording measured by the international 10-20 system with electrodes applied with paste and impedances below 5000 ohms performed as portable with EKG monitoring in an awake and asleep patient.  Hyperventilation and photic stimulation were not performed.  The digital EEG was referentially recorded, reformatted, and digitally filtered in a variety of bipolar and referential montages for optimal display.   Description: The patient is awake and asleep during the recording.  During maximal wakefulness, there is a symmetric, medium voltage 7 Hz posterior dominant rhythm that attenuates with eye opening. This is admixed with a small amount of diffuse 4-6 Hz theta slowing of the waking background.  During drowsiness and sleep, there is an increase in theta slowing of the background with occasional vertex waves seen.  Hyperventilation and photic stimulation were not performed.  There were no epileptiform discharges or electrographic seizures seen.    EKG lead showed irregular rhythm.  Impression: This awake and asleep EEG is abnormal due to mild diffuse slowing of the waking background.  Clinical Correlation  of the above findings indicates diffuse cerebral dysfunction that is non-specific in etiology and can be seen with hypoxic/ischemic injury, toxic/metabolic encephalopathies, neurodegenerative disorders, or medication effect.  The absence of epileptiform discharges does not rule out a clinical diagnosis of epilepsy.  Clinical correlation is advised.   Ellouise Newer, M.D.

## 2017-02-16 ENCOUNTER — Inpatient Hospital Stay (HOSPITAL_COMMUNITY): Payer: Medicare Other

## 2017-02-16 DIAGNOSIS — I639 Cerebral infarction, unspecified: Secondary | ICD-10-CM

## 2017-02-16 DIAGNOSIS — I4891 Unspecified atrial fibrillation: Secondary | ICD-10-CM

## 2017-02-16 DIAGNOSIS — G8191 Hemiplegia, unspecified affecting right dominant side: Secondary | ICD-10-CM

## 2017-02-16 LAB — BASIC METABOLIC PANEL
Anion gap: 9 (ref 5–15)
BUN: 13 mg/dL (ref 6–20)
CHLORIDE: 108 mmol/L (ref 101–111)
CO2: 24 mmol/L (ref 22–32)
CREATININE: 1.1 mg/dL (ref 0.61–1.24)
Calcium: 9.1 mg/dL (ref 8.9–10.3)
GFR calc non Af Amer: 58 mL/min — ABNORMAL LOW (ref >60)
GLUCOSE: 108 mg/dL — AB (ref 65–99)
Potassium: 3.5 mmol/L (ref 3.5–5.1)
Sodium: 141 mmol/L (ref 135–145)

## 2017-02-16 LAB — CBC
HEMATOCRIT: 43 % (ref 39.0–52.0)
HEMOGLOBIN: 14.4 g/dL (ref 13.0–17.0)
MCH: 31.9 pg (ref 26.0–34.0)
MCHC: 33.5 g/dL (ref 30.0–36.0)
MCV: 95.1 fL (ref 78.0–100.0)
Platelets: 185 10*3/uL (ref 150–400)
RBC: 4.52 MIL/uL (ref 4.22–5.81)
RDW: 13.7 % (ref 11.5–15.5)
WBC: 5.5 10*3/uL (ref 4.0–10.5)

## 2017-02-16 LAB — HEMOGLOBIN A1C
Hgb A1c MFr Bld: 5.7 % — ABNORMAL HIGH (ref 4.8–5.6)
MEAN PLASMA GLUCOSE: 117 mg/dL

## 2017-02-16 LAB — ECHOCARDIOGRAM COMPLETE
Height: 68 in
Weight: 3238.12 oz

## 2017-02-16 MED ORDER — DABIGATRAN ETEXILATE MESYLATE 150 MG PO CAPS: 150.0000 mg | ORAL_CAPSULE | Freq: Two times a day (BID) | ORAL | 1 refills | Status: DC

## 2017-02-16 MED ORDER — ATORVASTATIN CALCIUM 10 MG PO TABS
20.0000 mg | ORAL_TABLET | Freq: Every day | ORAL | Status: DC
  Administered 2017-02-16: 20 mg via ORAL
  Filled 2017-02-16: qty 2

## 2017-02-16 MED ORDER — ATORVASTATIN CALCIUM 20 MG PO TABS: 20.0000 mg | ORAL_TABLET | Freq: Every day | ORAL | 1 refills | Status: DC

## 2017-02-16 NOTE — Care Management Note (Signed)
Case Management Note  Patient Details  Name: Carlos Ballard MRN: [id number] Date of Birth: [birth year]  Subjective/Objective:    Pt admitted with CVA. He is from home with family.                Action/Plan: PT/OT recommending SNF. Daughter wants to take patient home. CM provided her a list of [place] agencies. She selected [facility]. Cory with [facility] accepted the referral.  Pt with orders for wheelchair. Pt unable to receive this DME tonight. Daughter provided order for wheelchair and she plans on picking it up later.  Daughter going to provide transportation home.   Expected Discharge Date:  02/16/17               Expected Discharge Plan:  [place]  In-House Referral:     Discharge planning Services  CM Consult  Post Acute Care Choice:  Durable Medical Equipment, Home Health Choice offered to:  Adult Children  DME Arranged:  Programmer, multimedia DME Agency:     HH Arranged:  RN, PT, OT, Nurse's Aide [address] Agency:  [facility]  Status of Service:  Completed, signed off  If discussed at [place] of Stay Meetings, dates discussed:    Additional Comments:  Pollie Friar, RN 02/16/2017, 5:26 PM

## 2017-02-16 NOTE — Clinical Social Work Note (Signed)
CSW met with patient and his daughter to discuss SNF placement. Patient's daughter prefers for patient to return home with HHPT. She plans to ask a neighbor to sit with him while she is at work so that he has 24/7 supervision. RNCM notified.  CSW signing off. Consult again if any other social work needs arise.  Dayton Scrape, [place]

## 2017-02-16 NOTE — Discharge Instructions (Signed)
Ischemic Stroke °An ischemic stroke (cerebrovascular accident, or CVA) is the sudden death of brain tissue that occurs when an area of the brain does not get enough oxygen. It is a medical emergency that must be treated right away. An ischemic stroke can cause permanent loss of brain function. This can cause problems with how different parts of your body function. °What are the causes? °This condition is caused by a decrease of oxygen supply to an area of the brain, which may be the result of: °· A small blood clot (embolus) or a buildup of plaque in the blood vessels (atherosclerosis) that blocks blood flow in the brain. °· An abnormal heart rhythm (atrial fibrillation). °· A blocked or damaged artery in the head or neck. °What increases the risk? °Certain factors may make you more likely to develop this condition. Some of these factors are things that you can change, such as: °· Obesity. °· Smoking cigarettes. °· Taking oral birth control, especially if you also use tobacco. °· Physical inactivity. °· Excessive alcohol use. °· Use of illegal drugs, especially cocaine and methamphetamine. °Other risk factors include: °· High blood pressure (hypertension). °· High cholesterol. °· Diabetes mellitus. °· Heart disease. °· Being African American, Native American, Hispanic, or [place] Native. °· Being over age 60. °· Family history of stroke. °· Previous history of blood clots, stroke, or transient ischemic attack (TIA). °· Sickle cell disease. °· Being a woman with a history of preeclampsia. °· Migraine headache. °· Sleep apnea. °· Irregular heartbeats, such as atrial fibrillation. °· Chronic inflammatory diseases, such as rheumatoid arthritis or lupus. °· Blood clotting disorders (hypercoagulable state). °What are the signs or symptoms? °Symptoms of this condition usually develop suddenly, or you may notice them after waking up from sleep. Symptoms may include sudden: °· Weakness or numbness in your face, arm, or leg,  especially on one side of your body. °· Trouble walking or difficulty moving your arms or legs. °· Loss of balance or coordination. °· Confusion. °· Slurred speech (dysarthria). °· Trouble speaking, understanding speech, or both (aphasia). °· Vision changes--such as double vision, blurred vision, or loss of vision--in one or both eyes. °· Dizziness. °· Nausea and vomiting. °· Severe headache with no known cause. The headache is often described as the worst headache ever experienced. °If possible, make note of the exact time that you last felt like your normal self and what time your symptoms started. Tell your health care provider. °If symptoms come and go, this could be a sign of a warning stroke, or TIA. Get help right away, even if you feel better. °How is this diagnosed? °This condition may be diagnosed based on: °· Your symptoms, your medical history, and a physical exam. °· CT scan of the brain. °· MRI. °· CT angiogram. This test uses a computer to take X-rays of your arteries. A dye may be injected into your blood to show the inside of your blood vessels more clearly. °· MRI angiogram. This is a type of MRI that is used to evaluate the blood vessels. °· Cerebral angiogram. This test uses X-rays and a dye to show the blood vessels in the brain and neck. °You may need to see a health care provider who specializes in stroke care. A stroke specialist can be seen in person or through communication using telephone or television technology (telemedicine). °Other tests may also be done to find the cause of the stroke, such as: °· Electrocardiogram (ECG). °· Continuous heart monitoring. °· Echocardiogram. °·   Carotid ultrasound. °· A scan of the brain circulation. °· Blood tests. °· Sleep study to check for sleep apnea. °How is this treated? °Treatment for this condition will depend on the duration, severity, and cause of your symptoms and on the area of the brain affected. It is very important to get treatment at the  first sign of stroke symptoms. Some treatments work better if they are done within 3-6 hours of the onset of stroke symptoms. These initial treatments may include: °· Aspirin. °· Medicines to control blood pressure. °· Medicine given by injection to dissolve the blood clot (thrombolytic). °· Treatments given directly to the affected artery to remove or dissolve the blood clot. °Other treatment options may include: °· Oxygen. °· IV fluids. °· Medicines to thin the blood (anticoagulants or antiplatelets). °· Procedures to increase blood flow. °Medicines and changes to your diet may be used to help treat and manage risk factors for stroke, such as diabetes, high cholesterol, and high blood pressure. °After a stroke, you may work with physical, speech, mental health, or occupational therapists to help you recover. °Follow these instructions at home: °Medicines  °· Take over-the-counter and prescription medicines only as told by your health care provider. °· If you were told to take a medicine to thin your blood, such as aspirin or an anticoagulant, take it exactly as told by your health care provider. °¨ Taking too much blood-thinning medicine can cause bleeding. °¨ If you do not take enough blood-thinning medicine, you will not have the protection that you need against another stroke and other problems. °· Understand the side effects of taking anticoagulant medicine. When taking this type of medicine, make sure you: °¨ Hold pressure over any cuts for longer than usual. °¨ Tell your dentist and other health care providers that you are taking anticoagulants before you have any procedures that may cause bleeding. °¨ Avoid activities that may cause trauma or injury. °Eating and drinking  °· Follow instructions from your health care provider about diet. °· Eat healthy foods. °· If your ability to swallow was affected by the stroke, you may need to take steps to avoid choking, such as: °¨ Taking small bites when  eating. °¨ Eating foods that are soft or pureed. °Safety  °· Follow instructions from your health care team about physical activity. °· Use a Giusto or cane as told by your health care provider. °· Take steps to create a safe home environment in order to reduce the risk of falls. This may include: °¨ Having your home looked at by specialists. °¨ Installing grab bars in the bedroom and bathroom. °¨ Using safety equipment, such as raised toilets and a seat in the shower. °General instructions  °· Do not use any tobacco products, such as cigarettes, chewing tobacco, and e-cigarettes. If you need help quitting, ask your health care provider. °· Limit alcohol intake to no more than 1 drink a day for nonpregnant women and 2 drinks a day for men. One drink equals 12 oz of beer, 5 oz of wine, or 1½ oz of hard liquor. °· If you need help to stop using drugs or alcohol, ask your health care provider about a referral to a program or specialist. °· Maintain an active and healthy lifestyle. Get regular exercise as told by your health care provider. °· Keep all follow-up visits as told by your health care provider, including visits with all specialists on your health care team. This is important. °How is this prevented? °Your   risk of another stroke can be decreased by managing high blood pressure, high cholesterol, diabetes, heart disease, sleep apnea, and obesity. It can also be decreased by quitting smoking, limiting alcohol, and staying physically active. °Your health care provider will continue to work with you on measures to prevent short-term and long-term complications of stroke. °Get help right away if: °You have: °· Sudden weakness or numbness in your face, arm, or leg, especially on one side of your body. °· Sudden confusion. °· Sudden trouble speaking, understanding, or both (aphasia). °· Sudden trouble seeing with one or both eyes. °· Sudden trouble walking or difficulty moving your arms or legs. °· Sudden  dizziness. °· Sudden loss of balance or coordination. °· Sudden, severe headache with no known cause. °· A partial or total loss of consciousness. °· A seizure. °Any of these symptoms may represent a serious problem that is an emergency. Do not wait to see if the symptoms will go away. Get medical help right away. Call your local emergency services (911 in [place] Do not drive yourself to the hospital. °This information is not intended to replace advice given to you by your health care provider. Make sure you discuss any questions you have with your health care provider. °Document Released: 10/03/2005 Document Revised: 03/15/2016 Document Reviewed: 12/30/2015 °Elsevier Interactive Patient Education © 2017 [employer] ° °

## 2017-02-16 NOTE — Discharge Summary (Signed)
[facility]  Physician Discharge Summary   Patient ID: Carlos Ballard MRN: [id number] DOB/AGE: 04/19/1928 83 y.o.  Admit date: 02/15/2017 Discharge date: 02/16/2017  PCP: Glenda Chroman, MD  DISCHARGE DIAGNOSES:  Principal Problem:   Right sided weakness Active Problems:   NPH (normal pressure hydrocephalus)   Seizure disorder (HCC)   Atrial fibrillation (HCC)   Stroke (HCC)   Dementia without behavioral disturbance   TIA (transient ischemic attack)   GERD (gastroesophageal reflux disease)   Essential hypertension   Right leg weakness   RECOMMENDATIONS FOR OUTPATIENT FOLLOW UP: 1. Home health has been ordered 2. Outpatient follow-up with neurology and neurosurgeon   DISCHARGE CONDITION: fair  Diet recommendation: As before  Filed Weights   02/15/17 0030  Weight: 91.8 kg (202 lb 6.1 oz)    INITIAL HISTORY: 83 year old male with a past medical history of seizure disorder, stroke, obstructive hydrocephalus status post VP shunt, melanoma, dementia, atrial fibrillation on Primaxin, who presented with right-sided weakness. Patient had been having right leg weakness for about a week or so. And then on the day of admission, patient's daughter found him to have profound right upper and lower extremity weakness along with facial droop. EMS was called and the patient was taken to an outside facility from where he was transferred over to this facility for further management.  Consultants: Neurology  Procedures:  Echocardiogram Study Conclusions  - Left ventricle: The cavity size was normal. Wall thickness was   increased in a pattern of moderate LVH. Systolic function was   normal. The estimated ejection fraction was in the range of 60%   to 65%. Doppler parameters are consistent with abnormal left   ventricular relaxation (grade 1 diastolic dysfunction).  Carotid Dopplers  Bilateral: No significant (1-39%) ICA stenosis. Antegrade vertebral flow.    EEG Impression: This awake and asleep EEG is abnormal due to mild diffuse slowing of the waking background. Clinical Correlation of the above findings indicates diffuse cerebral dysfunction that is non-specific in etiology and can be seen with hypoxic/ischemic injury, toxic/metabolic encephalopathies, neurodegenerative disorders, or medication effect.  The absence of epileptiform discharges does not rule out a clinical diagnosis of epilepsy.  Clinical correlation is advised.   HOSPITAL COURSE:   Acute stroke causing Right sided weakness Patient's symptoms had mostly resolved by the time he was transferred over to this facility. Patient was seen by neurology. Discussed with Dr. Saintclair Halsted with neurosurgery regarding patient's VP shunt and need to do MRI. MRI was subsequently done which revealed an acute stroke. Recommendation is to continue anticoagulation. Patient was taking lower dose of Pradaxa due to hematuria many years ago. He hasn't had any episodes of bleeding recently according to daughter. Recommend reinitiating therapeutic dose of Pradaxa. She agrees to this. She has been told that if he experiences episodes of bleeding, then she will need to discuss further with PCP and may need to decrease the dose again. Echocardiogram as above. Carotid Doppler does not show any significant stenosis. Seen by physical and occupational therapy. Skilled nursing facility was recommended. However, daughter wishes to take him home. Home health will be ordered.  History of NPH (normal pressure hydrocephalus) Seems to be stable. Patient will need shunt evaluation in the next couple of weeks, which can be arranged as an outpatient. Daughter will make appointment.  History of Seizure disorder Followed by Dr. Leta Baptist. Continue Keppra. EEG did not show any epileptiform activity.  History of Atrial Fibrillation CHA2DS2-VASc Scoreis 5. Patient is on pradaxaat home.  Heart rate is well controlled. Continue  pradaxa and cardizem.   Moderate dementia without behavioral disturbance Continue donepezil.  GERD Continue Protonix.  History of essential hypertension  Continue home medications. Blood pressure is stable.  AKI vs. CKD-III Patient creatinine is 1.34. His creatinine was 0.85 on 10/30/09. Creatinine is now normal. Resolved.  Workup has been completed. Discussed in detail with patient's daughter. She wishes to take him home. [place] for Discharge.    PERTINENT LABS:  The results of significant diagnostics from this hospitalization (including imaging, microbiology, ancillary and laboratory) are listed below for reference.     Labs: Basic Metabolic Panel:  Recent Labs Lab 02/15/17 1046 02/16/17 0407  NA 141 141  K 3.9 3.5  CL 107 108  CO2 25 24  GLUCOSE 113* 108*  BUN 12 13  CREATININE 1.26* 1.10  CALCIUM 9.4 9.1   Liver Function Tests:  Recent Labs Lab 02/15/17 1046  AST 22  ALT 21  ALKPHOS 55  BILITOT 1.0  PROT 6.3*  ALBUMIN 3.7   CBC:  Recent Labs Lab 02/16/17 0407  WBC 5.5  HGB 14.4  HCT 43.0  MCV 95.1  PLT 185    IMAGING STUDIES Mr Jodene Nam Head Wo Contrast  Result Date: 02/16/2017 CLINICAL DATA:  Initial evaluation for acute slurred speech, right-sided weakness. EXAM: MRI HEAD WITHOUT CONTRAST MRA HEAD WITHOUT CONTRAST TECHNIQUE: Multiplanar, multiecho pulse sequences of the brain and surrounding structures were obtained without intravenous contrast. Angiographic images of the head were obtained using MRA technique without contrast. COMPARISON:  Prior CT from 02/14/2017. FINDINGS: MRI HEAD FINDINGS Brain: Diffuse prominence of the CSF containing spaces is compatible with generalized age-related cerebral atrophy. Patchy and confluent T2/FLAIR hyperintensity within the periventricular and deep white matter both cerebral hemispheres most consistent with chronic microvascular ischemic changes. Chronic microvascular ischemic disease present within the pons is  well. Overall, changes mild for age. Small remote right cerebellar infarct noted. Acute appy why sequence mildly limited from susceptibility fact from right parietal shunt. There is patchy diffusion signal abnormality within the ventral aspect of the inferior pons, suspicious for a small acute ischemic infarct (series 3, image 14). This is central/left paracentral in location. Area of infarct measures approximately 10 x 8 mm. No associated mass effect or hemorrhage. No other evidence for acute or subacute ischemia. Gray-white matter differentiation otherwise maintained. No evidence for acute intracranial hemorrhage. Small punctate focus of susceptibility fact within the left medulla most consistent with a small chronic microhemorrhage (series 9, image 6). Right parietal shunt catheter in place with tip terminating near the foramen of Monro. Ventricle size is stable. Encephalomalacia with gliosis and chronic hemorrhagic blood products noted along the shunt catheter tract in the right parietal region. Chronic appearing bilateral subdural hematoma is measure up to 9 mm bilaterally, likely related to chronic shunting. Subdural collections extend around the cerebellum and brainstem/cervicomedullary junction as well. No mass lesion or midline shift. The pituitary gland and suprasellar region within normal limits. Vascular: Major intracranial vascular flow voids are maintained. Skull and upper cervical spine: Craniocervical junction within normal limits. Visualized upper cervical spine unremarkable. Bone marrow signal intensity within normal limits. No acute scalp soft tissue abnormality. Right parieto-occipital scalp obscured by susceptibility artifact from shunt catheter. Sinuses/Orbits: Globes and orbital soft tissues within normal limits. Small retention cyst noted within the left maxillary sinus. Paranasal sinuses are otherwise clear. Trace bilateral mastoid effusions. Inner ear structures normal. Other: No other  significant finding. MRA HEAD FINDINGS ANTERIOR CIRCULATION: Study  limited by motion artifact. Distal cervical segments of the internal carotid arteries are patent with antegrade flow. Distal cervical left ICA mildly tortuous. Petrous, cavernous, supraclinoid segments patent without flow-limiting stenosis. A1 segments patent. Anterior communicating artery not well seen on this motion degraded exam. Anterior cerebral arteries patent to their distal aspects. M1 segments grossly patent without stenosis or occlusion, although evaluation of the mid and distal M1 segments limited due to motion. MCA bifurcations grossly normal. Proximal M2 branches not well evaluated on this motion degraded exam. Distal MCA branches well opacified and symmetric. Distal small vessel atheromatous irregularity noted. POSTERIOR CIRCULATION: Left vertebral artery dominant and widely patent to the vertebrobasilar junction. Right vertebral artery hypoplastic and terminates in PICA. Left PICA not visualized. Basilar artery mildly tortuous but widely patent to its distal aspect. Origin of the superior cerebral arteries patent bilaterally. PCAs appear to be supplied via the basilar artery bilaterally. PCAs patent at their origins. P2 segments not well evaluated on this motion degraded exam, although flow is seen within the distal PCA segments. No obvious vascular occlusion. No definite aneurysm or vascular malformation. IMPRESSION: MRI HEAD IMPRESSION: 1. Small approximate 1 cm focus of patchy diffusion abnormality within the central/left central aspect of the inferior ventral pons, suspicious for small acute ischemic infarct. No associated hemorrhage or mass effect. 2. No other acute intracranial process identified. 3. Right parietal ventriculostomy catheter in stable position. Stable ventricular size. 4. Bilateral subdural collections with extension about the cerebellum and brainstem, likely due to chronic shunting. No evidence for acute  intracranial hemorrhage. 5. Small remote right cerebellar infarct. 6. Generalized cerebral atrophy with mild chronic small vessel ischemic disease. MRA HEAD IMPRESSION: 1. Motion degraded exam. No definite large vessel occlusion or high-grade stenosis. 2. Hypoplastic right vertebral artery terminates in PICA. Vertebrobasilar system otherwise patent. Electronically Signed   By: Jeannine Boga M.D.   On: 02/16/2017 00:52   Mr Brain Wo Contrast  Result Date: 02/16/2017 CLINICAL DATA:  Initial evaluation for acute slurred speech, right-sided weakness. EXAM: MRI HEAD WITHOUT CONTRAST MRA HEAD WITHOUT CONTRAST TECHNIQUE: Multiplanar, multiecho pulse sequences of the brain and surrounding structures were obtained without intravenous contrast. Angiographic images of the head were obtained using MRA technique without contrast. COMPARISON:  Prior CT from 02/14/2017. FINDINGS: MRI HEAD FINDINGS Brain: Diffuse prominence of the CSF containing spaces is compatible with generalized age-related cerebral atrophy. Patchy and confluent T2/FLAIR hyperintensity within the periventricular and deep white matter both cerebral hemispheres most consistent with chronic microvascular ischemic changes. Chronic microvascular ischemic disease present within the pons is well. Overall, changes mild for age. Small remote right cerebellar infarct noted. Acute appy why sequence mildly limited from susceptibility fact from right parietal shunt. There is patchy diffusion signal abnormality within the ventral aspect of the inferior pons, suspicious for a small acute ischemic infarct (series 3, image 14). This is central/left paracentral in location. Area of infarct measures approximately 10 x 8 mm. No associated mass effect or hemorrhage. No other evidence for acute or subacute ischemia. Gray-white matter differentiation otherwise maintained. No evidence for acute intracranial hemorrhage. Small punctate focus of susceptibility fact within the  left medulla most consistent with a small chronic microhemorrhage (series 9, image 6). Right parietal shunt catheter in place with tip terminating near the foramen of Monro. Ventricle size is stable. Encephalomalacia with gliosis and chronic hemorrhagic blood products noted along the shunt catheter tract in the right parietal region. Chronic appearing bilateral subdural hematoma is measure up to 9 mm bilaterally,  likely related to chronic shunting. Subdural collections extend around the cerebellum and brainstem/cervicomedullary junction as well. No mass lesion or midline shift. The pituitary gland and suprasellar region within normal limits. Vascular: Major intracranial vascular flow voids are maintained. Skull and upper cervical spine: Craniocervical junction within normal limits. Visualized upper cervical spine unremarkable. Bone marrow signal intensity within normal limits. No acute scalp soft tissue abnormality. Right parieto-occipital scalp obscured by susceptibility artifact from shunt catheter. Sinuses/Orbits: Globes and orbital soft tissues within normal limits. Small retention cyst noted within the left maxillary sinus. Paranasal sinuses are otherwise clear. Trace bilateral mastoid effusions. Inner ear structures normal. Other: No other significant finding. MRA HEAD FINDINGS ANTERIOR CIRCULATION: Study limited by motion artifact. Distal cervical segments of the internal carotid arteries are patent with antegrade flow. Distal cervical left ICA mildly tortuous. Petrous, cavernous, supraclinoid segments patent without flow-limiting stenosis. A1 segments patent. Anterior communicating artery not well seen on this motion degraded exam. Anterior cerebral arteries patent to their distal aspects. M1 segments grossly patent without stenosis or occlusion, although evaluation of the mid and distal M1 segments limited due to motion. MCA bifurcations grossly normal. Proximal M2 branches not well evaluated on this motion  degraded exam. Distal MCA branches well opacified and symmetric. Distal small vessel atheromatous irregularity noted. POSTERIOR CIRCULATION: Left vertebral artery dominant and widely patent to the vertebrobasilar junction. Right vertebral artery hypoplastic and terminates in PICA. Left PICA not visualized. Basilar artery mildly tortuous but widely patent to its distal aspect. Origin of the superior cerebral arteries patent bilaterally. PCAs appear to be supplied via the basilar artery bilaterally. PCAs patent at their origins. P2 segments not well evaluated on this motion degraded exam, although flow is seen within the distal PCA segments. No obvious vascular occlusion. No definite aneurysm or vascular malformation. IMPRESSION: MRI HEAD IMPRESSION: 1. Small approximate 1 cm focus of patchy diffusion abnormality within the central/left central aspect of the inferior ventral pons, suspicious for small acute ischemic infarct. No associated hemorrhage or mass effect. 2. No other acute intracranial process identified. 3. Right parietal ventriculostomy catheter in stable position. Stable ventricular size. 4. Bilateral subdural collections with extension about the cerebellum and brainstem, likely due to chronic shunting. No evidence for acute intracranial hemorrhage. 5. Small remote right cerebellar infarct. 6. Generalized cerebral atrophy with mild chronic small vessel ischemic disease. MRA HEAD IMPRESSION: 1. Motion degraded exam. No definite large vessel occlusion or high-grade stenosis. 2. Hypoplastic right vertebral artery terminates in PICA. Vertebrobasilar system otherwise patent. Electronically Signed   By: Jeannine Boga M.D.   On: 02/16/2017 00:52    DISCHARGE EXAMINATION: Vitals:   02/16/17 0216 02/16/17 0552 02/16/17 1028 02/16/17 1312  BP: 127/75 131/75 (!) 100/58 102/72  Pulse: 64 71 79 85  Resp: 20 16 17 18   Temp: 97.7 F (36.5 C) 97.6 F (36.4 C) 97.5 F (36.4 C) 98.6 F (37 C)  TempSrc:  Oral Oral Oral Oral  SpO2: 95% 96% 97% 97%  Weight:      Height:       General appearance: alert, cooperative, distracted and no distress Resp: clear to auscultation bilaterally Cardio: regular rate and rhythm, S1, S2 normal, no murmur, click, rub or gallop GI: soft, non-tender; bowel sounds normal; no masses,  no organomegaly Extremities: extremities normal, atraumatic, no cyanosis or edema No focal deficits appreciated.  DISPOSITION: Home with home health  Discharge Instructions    Call MD for:  extreme fatigue    Complete by:  As directed  Call MD for:  persistant dizziness or light-headedness    Complete by:  As directed    Call MD for:  persistant nausea and vomiting    Complete by:  As directed    Call MD for:  severe uncontrolled pain    Complete by:  As directed    Call MD for:  temperature >100.4    Complete by:  As directed    Diet - low sodium heart healthy    Complete by:  As directed    Discharge instructions    Complete by:  As directed    Please seek attention if you note blood in urine or stool. The dose of Pradaxa may have to be changed. Please be sure to follow up with your PCP in a week and with your neurologist in a few weeks.  You were cared for by a hospitalist during your hospital stay. If you have any questions about your discharge medications or the care you received while you were in the hospital after you are discharged, you can call the unit and asked to speak with the hospitalist on call if the hospitalist that took care of you is not available. Once you are discharged, your primary care physician will handle any further medical issues. Please note that NO REFILLS for any discharge medications will be authorized once you are discharged, as it is imperative that you return to your primary care physician (or establish a relationship with a primary care physician if you do not have one) for your aftercare needs so that they can reassess your need for  medications and monitor your lab values. If you do not have a primary care physician, you can call [phone] for a physician referral.   Increase activity slowly    Complete by:  As directed       ALLERGIES:  Allergies  Allergen Reactions  . Depakote [Divalproex Sodium] Swelling and Other (See Comments)     low blood counts  . Phenytek [Phenytoin Sodium Extended] Other (See Comments)    ineffective     Current Discharge Medication List    START taking these medications   Details  atorvastatin (LIPITOR) 20 MG tablet Take 1 tablet (20 mg total) by mouth daily at 6 PM. Qty: 30 tablet, Refills: 1      CONTINUE these medications which have CHANGED   Details  dabigatran (PRADAXA) 150 MG CAPS capsule Take 1 capsule (150 mg total) by mouth 2 (two) times daily. Qty: 60 capsule, Refills: 1      CONTINUE these medications which have NOT CHANGED   Details  diltiazem (CARDIZEM CD) 240 MG 24 hr capsule Take 240 mg by mouth daily.    donepezil (ARICEPT) 10 MG tablet Take 1 tablet (10 mg total) by mouth at bedtime. Qty: 90 tablet, Refills: 4    finasteride (PROSCAR) 5 MG tablet Take 5 mg by mouth every evening.     KEPPRA 750 MG tablet TAKE 2 TABLETS BY MOUTH TWO TIMES DAILY Qty: 360 tablet, Refills: 0    loratadine (CLARITIN) 10 MG tablet Take 10 mg by mouth at bedtime.    Multiple Vitamins-Minerals (MULTIVITAMIN PO) Take 1 tablet by mouth daily.    pantoprazole (PROTONIX) 40 MG tablet Take 40 mg by mouth daily as needed (for acid reflux).     valsartan (DIOVAN) 320 MG tablet Take 320 mg by mouth daily.      memantine (NAMENDA) 10 MG tablet Take 1 tablet (10 mg total) by mouth  2 (two) times daily. Qty: 60 tablet, Refills: 12      STOP taking these medications     fexofenadine (ALLEGRA) 180 MG tablet          Follow-up Information    POOL,HENRY A, MD. Schedule an appointment as soon as possible for a visit in 2 week(s).   Specialty:  Neurosurgery Why:  to check  shunt Contact information: [address] [place] [address] [phone]        PENUMALLI,VIKRAM, MD. Schedule an appointment as soon as possible for a visit in 4 week(s).   Specialties:  Neurology, Radiology Why:  for stroke Contact information: [address] [place] [address] [place]: 35 mins  [facility] Pager [phone]  02/16/2017, 4:19 PM

## 2017-02-16 NOTE — Progress Notes (Signed)
  Echocardiogram 2D Echocardiogram has been performed.  Journey Ratterman T Zailynn Brandel 02/16/2017, 10:13 AM

## 2017-02-16 NOTE — Evaluation (Addendum)
Physical Therapy Evaluation Patient Details Name: Carlos Ballard MRN: [id number] DOB: 25-Dec-1927 Today's Date: 02/16/2017   History of Present Illness  Pt is an 83 year old man with dementia and NPH, HOH admitted with right-sided weakness. MRI revealed small acute ischemic infarct in the inferior ventral pons; and Small remote right cerebellar infarct.  Clinical Impression  Pt presented sitting OOB in recliner chair, awake and willing to participate in therapy session. Pt's daughter present throughout evaluation. Prior to admission, pt's daughter reported that pt was independent with ADLs and recently began using a RW to ambulate. Pt ambulated a short distance (42') with RW and mod A with close chair follow. Pt's R side quickly fatigued. He required constant multimodal cueing for safety with ambulation and postural corrections. Pt's daughter expressed concerns that his functional mobility has greatly declined over the past day and she believes the pt has had a focal seizure today. Pt's RN was notified. Pt would continue to benefit from skilled physical therapy services at this time while admitted and after d/c to address the below listed limitations in order to improve overall safety and independence with functional mobility.     Follow Up Recommendations SNF;Supervision/Assistance - 24 hour (if pt/family refuses, pt will need 24/7 supervision assist and HHPT)    Equipment Recommendations  None recommended by PT;Other (comment) (pt has all DME at home)    Recommendations for Other Services       Precautions / Restrictions Precautions Precautions: Fall Restrictions Weight Bearing Restrictions: No      Mobility  Bed Mobility               General bed mobility comments: pt sitting OOB in recliner chair  Transfers Overall transfer level: Needs assistance Equipment used: Rolling [place] (2 wheeled) Transfers: Sit to/from Stand Sit to Stand: Min assist;+2 safety/equipment          General transfer comment: increased time, max cueing, multimodal cueing and min A x2 for safety with return to sit in chair  Ambulation/Gait Ambulation/Gait assistance: Mod assist;+2 safety/equipment Ambulation Distance (Feet): 20 Feet Assistive device: Rolling [place] (2 wheeled) Gait Pattern/deviations: Step-to pattern;Step-through pattern;Decreased step length - right;Decreased step length - left;Decreased stride length;Shuffle;Trunk flexed Gait velocity: decreased   General Gait Details: pt required constant mod A for stability and safety with RW. pt also required constant multimodal cueing to maintain a safe distance from RW. Verbal cues also provided for postural corrections.  Stairs            Wheelchair Mobility    Modified Rankin (Stroke Patients Only) Modified Rankin (Stroke Patients Only) Pre-Morbid Rankin Score: Moderately severe disability Modified Rankin: Moderately severe disability     Balance Overall balance assessment: Needs assistance Sitting-balance support: Feet supported Sitting balance-Leahy Scale: Fair     Standing balance support: During functional activity;Bilateral upper extremity supported Standing balance-Leahy Scale: Poor Standing balance comment: pt reliant on bilateral UEs on RW and min A for static standing                             Pertinent Vitals/Pain Pain Assessment: No/denies pain    Home Living Family/patient expects to be discharged to:: Private residence Living Arrangements: Children Available Help at Discharge: Family;Available PRN/intermittently Type of Home: House Home Access: Level entry     Home Layout: One level Home Equipment: [place] - 2 wheels;Cane - single point;Bedside commode;Wheelchair - manual      Prior Function  Level of Independence: Independent with assistive device(s)         Comments: independent with ADLs, ambulating recently with RW     Hand Dominance   Dominant Hand:  Right    Extremity/Trunk Assessment   Upper Extremity Assessment Upper Extremity Assessment: Defer to OT evaluation    Lower Extremity Assessment Lower Extremity Assessment: Generalized weakness       Communication   Communication: HOH  Cognition Arousal/Alertness: Awake/alert Behavior During Therapy: Flat affect Overall Cognitive Status: History of cognitive impairments - at baseline Area of Impairment: Orientation;Attention;Memory;Following commands;Safety/judgement;Awareness;Problem solving                 Orientation Level: Disoriented to;Place;Time;Situation Current Attention Level: Sustained Memory: Decreased short-term memory Following Commands: Follows one step commands inconsistently;Follows one step commands with increased time Safety/Judgement: Decreased awareness of safety;Decreased awareness of deficits Awareness: Intellectual Problem Solving: Slow processing;Decreased initiation;Difficulty sequencing;Requires verbal cues;Requires tactile cues General Comments: pt with history of dementia at baseline; however, pt's daughter present and reporting that pt's confusion is much worse than normal and that he has been more lethargic today than usual. Pt's daughter suspects that pt has had a focal seizure today (RN notified).      General Comments      Exercises     Assessment/Plan    PT Assessment Patient needs continued PT services  PT Problem List Decreased strength;Decreased balance;Decreased mobility;Decreased coordination;Decreased cognition;Decreased knowledge of use of DME;Decreased safety awareness       PT Treatment Interventions DME instruction;Gait training;Stair training;Functional mobility training;Therapeutic activities;Therapeutic exercise;Balance training;Neuromuscular re-education;Cognitive remediation;Patient/family education    PT Goals (Current goals can be found in the Care Plan section)  Acute Rehab PT Goals Patient Stated Goal: return  home PT Goal Formulation: With patient/family Time For Goal Achievement: 03/02/17 Potential to Achieve Goals: Fair    Frequency Min 3X/week   Barriers to discharge Decreased caregiver support      Co-evaluation PT/OT/SLP Co-Evaluation/Treatment: Yes Reason for Co-Treatment: For patient/therapist safety;To address functional/ADL transfers PT goals addressed during session: Mobility/safety with mobility;Balance;Proper use of DME         AM-PAC PT "6 Clicks" Daily Activity  Outcome Measure Difficulty turning over in bed (including adjusting bedclothes, sheets and blankets)?: A Lot Difficulty moving from lying on back to sitting on the side of the bed? : Total Difficulty sitting down on and standing up from a chair with arms (e.g., wheelchair, bedside commode, etc,.)?: Total Help needed moving to and from a bed to chair (including a wheelchair)?: A Lot Help needed walking in hospital room?: A Lot Help needed climbing 3-5 steps with a railing? : Total 6 Click Score: 9    End of Session Equipment Utilized During Treatment: Gait belt Activity Tolerance: Patient tolerated treatment well Patient left: in chair;with call bell/phone within reach;with family/visitor present Nurse Communication: Mobility status PT Visit Diagnosis: Unsteadiness on feet (R26.81);Other abnormalities of gait and mobility (R26.89)    Time: 1340-1405 PT Time Calculation (min) (ACUTE ONLY): 25 min   Charges:   PT Evaluation $PT Eval Moderate Complexity: 1 Procedure     PT G Codes:        Sherie Don, PT, DPT [place] 02/16/2017, 2:16 PM

## 2017-02-16 NOTE — Progress Notes (Signed)
*  PRELIMINARY RESULTS* Vascular Ultrasound Carotid Duplex (Doppler) has been completed.  Preliminary findings: Bilateral: No significant (1-39%) ICA stenosis. Antegrade vertebral flow.   Landry Mellow, RDMS, RVT  02/16/2017, 4:09 PM

## 2017-02-16 NOTE — Progress Notes (Signed)
Late entry:  About 1600 02/15/17, after EEG was completed, while waiting for transport to come to return pt to his room, pt became restless and was trying to get out of bed. Pt had been on transport list pending for about 50 min. TAt this point,this tech called for assistance, [facility] staff came, as well as pt's RN from [facility], bringing pt's daughter.  Pt was calmed by staff and was transported back to his room without further incident.

## 2017-02-16 NOTE — Evaluation (Signed)
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 MRI revealed small acute ischemic infarct in the inferior ventral pons; and Small remote right cerebellar infarct.   Clinical Impression   PTA Pt modified independent in ADL and mobility with SPC/RW. Pt currently needs to perform ADL in sitting due to standing fatigue and safety as well as requires min A for +2 for safety with functional transfers. Pt's daughter reports they have appropriate DME at home with the exception of a shower chair (Pt had previously been standing to shower - NOT safe at this time). Pt's daughter feels very strongly that due to him dementia he will recover and return to baseline being back home and in a familiar environment. I am personally conflicted knowing the benefits of this environmental truth and the reality of his increased fatigue and right sided weakness. Pt will continue to benefit from skilled OT in the acute setting and will require follow up therapy upon dc to maximize safety and independence in ADL and return Pt to PLOF. OT to continue to follow.     Follow Up Recommendations  SNF;Other (comment);Supervision/Assistance - 24 hour (Daughter likely to refuse, MUST have 24 hour A for safety)    Equipment Recommendations  Tub/shower seat    Recommendations for Other Services       Precautions / Restrictions Precautions Precautions: Fall Restrictions Weight Bearing Restrictions: No      Mobility Bed Mobility               General bed mobility comments: pt sitting OOB in recliner chair  Transfers Overall transfer level: Needs assistance Equipment used: Rolling [place] (2 wheeled) Transfers: Sit to/from Stand Sit to Stand: Min assist;+2 safety/equipment         General transfer comment:  increased time, max cueing, multimodal cueing and min A x2 for safety with return to sit in chair    Balance Overall balance assessment: Needs assistance Sitting-balance support: Feet supported Sitting balance-Leahy Scale: Fair     Standing balance support: During functional activity;Bilateral upper extremity supported Standing balance-Leahy Scale: Poor Standing balance comment: pt reliant on bilateral UEs on RW and min A for static standing                           ADL either performed or assessed with clinical judgement   ADL Overall ADL's : Needs assistance/impaired Eating/Feeding: Set up;Sitting   Grooming: Set up;Sitting;With caregiver independent assisting Grooming Details (indicate cue type and reason): Pt unable to maintain standing for ADL - Per daughter Pt was independent in ADL PTA Upper Body Bathing: Supervision/ safety;Sitting   Lower Body Bathing: Supervison/ safety;Sitting/lateral leans   Upper Body Dressing : Set up;Sitting   Lower Body Dressing: Moderate assistance;Sit to/from stand   Toilet Transfer: Minimal assistance;+2 for safety/equipment;Cueing for sequencing;Cueing for safety;RW;Comfort height toilet Toilet Transfer Details (indicate cue type and reason): simulated with recliner Toileting- Clothing Manipulation and Hygiene: Min guard;Sit to/from stand   Scientist, research (medical): With caregiver independent assisting;Minimal assistance;+2 for safety/equipment   Functional mobility during ADLs: Minimal assistance;+2 for safety/equipment;Rolling [place];Cueing for sequencing;Cueing for safety General ADL Comments: Per daughter Pt is at baseline for cognitive status. Pt's daughter also shared she's seeing increased fatigue and thinks it might be from another siezure.  Vision Baseline Vision/History: Wears glasses Wears Glasses: Reading only Patient Visual Report: No change from baseline       Perception     Praxis      Pertinent  Vitals/Pain Pain Assessment: No/denies pain     Hand Dominance Right   Extremity/Trunk Assessment Upper Extremity Assessment Upper Extremity Assessment: RUE deficits/detail;Generalized weakness RUE Deficits / Details: during manual muscle testing, RUE was able to hold 90 degrees forward flexion with eyes for 10 seconds on second trial. On first trial Pt with significant drag and unable to hold forward flexion.   Lower Extremity Assessment Lower Extremity Assessment: Defer to PT evaluation       Communication Communication Communication: HOH   Cognition Arousal/Alertness: Awake/alert Behavior During Therapy: Flat affect Overall Cognitive Status: History of cognitive impairments - at baseline Area of Impairment: Orientation;Attention;Memory;Following commands;Safety/judgement;Awareness;Problem solving                 Orientation Level: Disoriented to;Place;Time;Situation Current Attention Level: Sustained Memory: Decreased short-term memory Following Commands: Follows one step commands inconsistently;Follows one step commands with increased time Safety/Judgement: Decreased awareness of safety;Decreased awareness of deficits Awareness: Intellectual Problem Solving: Slow processing;Decreased initiation;Difficulty sequencing;Requires verbal cues;Requires tactile cues General Comments: pt with history of dementia at baseline; however, pt's daughter present and reporting that pt's confusion is much worse than normal and that he has been more lethargic today than usual. Pt's daughter suspects that pt has had a focal seizure today (RN notified).   General Comments  Pt's daughter and legal guarding present for entire session. She feels very strongly that the longer he stays away from his home environment the more confused that he gets.     Exercises     Shoulder Instructions      Home Living Family/patient expects to be discharged to:: Private residence Living Arrangements:  Children Available Help at Discharge: Family;Available PRN/intermittently Type of Home: House Home Access: Level entry     Home Layout: One level     Bathroom Shower/Tub: Occupational psychologist: Standard Bathroom Accessibility: Yes How Accessible: Accessible via wheelchair Home Equipment: [place] - 2 wheels;Cane - single point;Bedside commode;Wheelchair - manual          Prior Functioning/Environment Level of Independence: Independent with assistive device(s)        Comments: independent with ADLs, ambulating recently with RW        OT Problem List: Decreased activity tolerance;Impaired balance (sitting and/or standing);Decreased coordination;Decreased cognition;Decreased safety awareness;Decreased knowledge of use of DME or AE      OT Treatment/Interventions: Self-care/ADL training;Therapeutic exercise;DME and/or AE instruction;Therapeutic activities;Cognitive remediation/compensation;Patient/family education;Balance training    OT Goals(Current goals can be found in the care plan section) Acute Rehab OT Goals Patient Stated Goal: return home OT Goal Formulation: With patient/family Time For Goal Achievement: 03/02/17 Potential to Achieve Goals: Good ADL Goals Pt Will Perform Upper Body Bathing: with modified independence;sitting Pt Will Perform Lower Body Bathing: with modified independence;sit to/from stand Pt Will Perform Tub/Shower Transfer: Tub transfer;with min guard assist;with caregiver independent in assisting;ambulating;shower seat  OT Frequency: Min 2X/week   Barriers to D/C:    PTA Pt being left alone for short times - not safe at this time. Need 24 hour supervision.        Co-evaluation PT/OT/SLP Co-Evaluation/Treatment: Yes Reason for Co-Treatment: For patient/therapist safety;To address functional/ADL transfers;Necessary to address cognition/behavior during functional activity PT goals addressed during session: Mobility/safety with  mobility;Balance;Proper use of DME OT goals addressed during session: ADL's  and self-care      AM-PAC PT "6 Clicks" Daily Activity     Outcome Measure Help from another person eating meals?: None Help from another person taking care of personal grooming?: A Little Help from another person toileting, which includes using toliet, bedpan, or urinal?: A Lot Help from another person bathing (including washing, rinsing, drying)?: A Lot Help from another person to put on and taking off regular upper body clothing?: A Little Help from another person to put on and taking off regular lower body clothing?: A Lot 6 Click Score: 16   End of Session Equipment Utilized During Treatment: Gait belt;Rolling [place] Nurse Communication: Mobility status;Other (comment) (Daughter reporting focal seizure earlier)  Activity Tolerance: Patient tolerated treatment well Patient left: in chair;with call bell/phone within reach;with family/visitor present  OT Visit Diagnosis: Unsteadiness on feet (R26.81);Other abnormalities of gait and mobility (R26.89);Muscle weakness (generalized) (M62.81);Other symptoms and signs involving the nervous system (R29.898)                Time: 1340-1405 OT Time Calculation (min): 25 min Charges:  OT General Charges $OT Visit: 1 Procedure OT Evaluation $OT Eval Moderate Complexity: 1 Procedure G-Codes:     Hulda Humphrey OTR/L [place] 02/16/2017, 2:46 PM

## 2017-02-16 NOTE — Progress Notes (Signed)
Neurology Progress Note   Subjective: No major 24 hours events reported. The patient has not complaints this morning with 10-oint ROS negative. He does not feel like he is having any more weakness on his right side. His daughter is present and reports that he slept well last night. She states that he has not complained of anything. She is concerned about getting him home as soon as possible because she notes that he tends to get very confused anytime he is in different surroundings away from home.    Medications reviewed and reconciled.   Pertinent meds: Pradaxa 150 mg BID Donepezil 10 mg qhs Keppra 1500 mg BID  Current Meds:   Current Facility-Administered Medications:  .  0.9 %  sodium chloride infusion, , Intravenous, Continuous, Ivor Costa, MD, Last Rate: 75 mL/hr at 02/15/17 0218, 1,000 mL at 02/15/17 0218 .  acetaminophen (TYLENOL) tablet 650 mg, 650 mg, Oral, Q4H PRN **OR** acetaminophen (TYLENOL) solution 650 mg, 650 mg, Per Tube, Q4H PRN **OR** acetaminophen (TYLENOL) suppository 650 mg, 650 mg, Rectal, Q4H PRN, Ivor Costa, MD .  dabigatran (PRADAXA) capsule 150 mg, 150 mg, Oral, Q12H, Bonnielee Haff, MD, 150 mg at 02/15/17 2102 .  diltiazem (CARDIZEM CD) 24 hr capsule 240 mg, 240 mg, Oral, Daily, Ivor Costa, MD, 240 mg at 02/15/17 0933 .  donepezil (ARICEPT) tablet 10 mg, 10 mg, Oral, QHS, Ivor Costa, MD, 10 mg at 02/15/17 2102 .  finasteride (PROSCAR) tablet 5 mg, 5 mg, Oral, Daily, Ivor Costa, MD, 5 mg at 02/15/17 0933 .  irbesartan (AVAPRO) tablet 300 mg, 300 mg, Oral, Daily, Ivor Costa, MD, 300 mg at 02/15/17 0933 .  levETIRAcetam (KEPPRA) tablet 1,500 mg, 1,500 mg, Oral, BID, Ivor Costa, MD, 1,500 mg at 02/15/17 2102 .  loratadine (CLARITIN) tablet 10 mg, 10 mg, Oral, Daily, Ivor Costa, MD, 10 mg at 02/15/17 0933 .  ondansetron (ZOFRAN) injection 4 mg, 4 mg, Intravenous, Q8H PRN, Ivor Costa, MD .  pantoprazole (PROTONIX) EC tablet 40 mg, 40 mg, Oral, Daily, Ivor Costa, MD, 40 mg at  02/15/17 0933 .  senna-docusate (Senokot-S) tablet 1 tablet, 1 tablet, Oral, QHS PRN, Ivor Costa, MD .  zolpidem (AMBIEN) tablet 5 mg, 5 mg, Oral, QHS PRN, Ivor Costa, MD  Objective:  Temp:  [97.5 F (36.4 C)-97.7 F (36.5 C)] 97.6 F (36.4 C) (05/03 0552) Pulse Rate:  [64-81] 71 (05/03 0552) Resp:  [15-20] 16 (05/03 0552) BP: (127-165)/(69-89) 131/75 (05/03 0552) SpO2:  [95 %-99 %] 96 % (05/03 0552)  General: WDWN elderly Caucasian man lying in bed in NAD. He was initially asleep but roused easily to voice. He is hard of hearing and requires a loud conversational voice to be heard. He is oriented to self and hospital only. He has mildly delayed speed of processing with some mild perseveration. Affect is bright. Comportment is normal.  HEENT: Neck is supple without lymphadenopathy. Mucous membranes are moist and the oropharynx is clear. Sclerae are anicteric. There is no conjunctival injection.  CV: Regular, no murmur. Carotid pulses are 2+ and symmetric with no bruits. Distal pulses 2+ and symmetric.  Lungs: CTAB on anterior auscultation Extremities: No C/C/E. Neuro: MS: As noted above.  CN: Pupils are equal and reactive from 3-->2 mm bilaterally. EOMI, no nystagmus. He has breakup of smooth pursuits in all directions. Facial sensation is intact to light touch. Face is symmetric at rest with normal strength and mobility. Hearing is intact to conversational voice. Voice is normal in tone and  quality. Palate elevates symmetrically. Uvula is midline. Bilateral SCM and trapezii are 5/5. Tongue is midline with normal bulk and mobility.  Motor: Normal bulk, tone. He has a mild R hemiparesis with 4+/5 strength with the extensors and grip in the RUE and with the flexors of the RLE, consistent with UMN pattern of weakness. He has slight pronation but no drift with the outstretched R arm. No tremor or other abnormal movements are observed.  Sensation: Intact to light touch. DTRs: 2+, brisker on the R.  Toes are downgoing on the L, mute on the R.  Coordination: Finger-to-nose is without dysmetria bilaterally. He is mildly impulsive with testing.   Labs: Lab Results  Component Value Date   WBC 5.5 02/16/2017   HGB 14.4 02/16/2017   HCT 43.0 02/16/2017   PLT 185 02/16/2017   GLUCOSE 108 (H) 02/16/2017   CHOL 151 02/15/2017   TRIG 296 (H) 02/15/2017   HDL 32 (L) 02/15/2017   LDLCALC 60 02/15/2017   ALT 21 02/15/2017   AST 22 02/15/2017   NA 141 02/16/2017   K 3.5 02/16/2017   CL 108 02/16/2017   CREATININE 1.10 02/16/2017   BUN 13 02/16/2017   CO2 24 02/16/2017   HGBA1C 5.7 (H) 02/15/2017   CBC Latest Ref Rng & Units 02/16/2017 10/30/2009 08/18/2009  WBC 4.0 - 10.5 K/uL 5.5 5.3 4.4  Hemoglobin 13.0 - 17.0 g/dL 14.4 15.4 16.2  Hematocrit 39.0 - 52.0 % 43.0 44.1 46.0  Platelets 150 - 400 K/uL 185 175 183    Lab Results  Component Value Date   HGBA1C 5.7 (H) 02/15/2017   Lab Results  Component Value Date   ALT 21 02/15/2017   AST 22 02/15/2017   ALKPHOS 55 02/15/2017   BILITOT 1.0 02/15/2017    Radiology:  I have personally and independently reviewed the MRI brain without contrast from 02/15/17. This shows a small patchy area of restricted diffusion in the ventral aspect of the L inferior pons, consistent with acute ischemic stroke. A ventricular shunt is in place via a R parietal approach with T2/FLAIR hyperintensity and mild chronic hemorrhagic changes in the white matter along the catheter tract. There is a moderate burden of chronic small vessel ischemic disease in the bihemispheric white matter. He has mild diffuse generalized atrophy without significant hydrocephalus.   I have personally and independently reviewed the MRA of the head without contrast from 02/15/17. There is poor signal in the R vertebral artery which appears hypoplastic. The basilar is somewhat tortuous but shows no significant stenosis and no occlusion. This study is somewhat limited by patient movement.    Other diagnostic studies:  TTE pending CUS pending  A/P:   1. Acute Ischemic Stroke: This is an acute stroke involving the left ventral pons. Given appearance in MRI It is most likely thrombotic in etiology. Known risk factors for cerebrovascular disease in this patient include afib, HTN, dyslipidemia, h/o prior stroke, and age. MRA head shows no significant pathology. hemoglobin a1c is 5.7, LDL is 60. CUS and TTE are pending. He is on Pradaxa but had to be placed on a lower dose (150 mg BID) because he experienced hematuria and GI bleeding on higher dose treatment. He has not had any bleeding since the dose was reduced. This should be continued at current dose given this history. He is no presently on statin; although LDL is at goal, he may benefit from the addition of a low-dose statin given that his current stroke is most  likely a small vessel thrombotic event so I will add atorvastatin 20 mg daily. Ensure adequate glucose control. No need for fruther permissive hypertension at this time. Avoid fever and hyperglycemia as these can extend the infarct. Initiate rehab services. DVT prophylaxis as needed.   2. Right hemiparesis: This is acute and mild, due to stroke. PT/OT/rehab.   3. Seizure disorder: He has not had any seizures during this admission and seems to be tolerating Keppra without any adverse effects or complications. Continue Keppra at outpatient dose of 1500 mg bid.   4. 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 No acute issues.   5. Dementia: This is chronic, due to NPH. This places him at risk of delirium from all causes. His daughter reports that he generally tends to have confusion anytime he is in unfamiliar environments and is hopeful that he will be able to be discharged home soon which is a reasonable concern. Continue donepezil. Continue to optimize metabolic status as needed. Optimize sleep-wake cycles, keeping the room bright and active by day, quiet and dark by  night.   The patient would likely benefit from acute rehab services. Recommend consultation of PT/OT with consideration for PM&R consult as appropriate depending upon clinical progress and therapists' recommendations.   Fall risk: Risk factors for falls include age, prior history of falls, need for assistance with ADLs at baseline, polypharmacy, baseline cognitive dysfunction, impaired mobility/gait, and new mild right hemiparesis. Strict fall precautions. Limit psychoactive medications and sedating medications.  Delirium risk: Risk factors for delirium include history of dementia/cognitive impairment, age, acute stroke, medications/polypharmacy, impaired ADLs at baseline, decreased hearing, and hospital admission. Continue to optimize metabolic status as you are. Minimize the use of opiates, benzos or any medication with strong anticholinergic properties as much as possible. Optimize sleep-wake cycles as much as you can by keeping the room bright with activity during the day and dark and quiet at night.    Needs outpatient neurology follow-up?: Recommend routine f/u after discharge--has seen Dr. Leta Baptist at [facility] in past.   This was discussed with the patient and his daughter. Education was provided on the diagnosis and expected evaluation and treatment. They are in agreement with the plan as noted. They were given the opportunity to ask any questions and these were addressed to their satisfaction.   Melba Coon, MD [facility]

## 2017-02-17 LAB — VAS US CAROTID
LCCADSYS: -93 cm/s
LCCAPDIAS: 27 cm/s
LCCAPSYS: 138 cm/s
LEFT ECA DIAS: -18 cm/s
LEFT VERTEBRAL DIAS: 17 cm/s
LICAPDIAS: -25 cm/s
Left CCA dist dias: -24 cm/s
Left ICA dist dias: -19 cm/s
Left ICA dist sys: -52 cm/s
Left ICA prox sys: -72 cm/s
RCCADSYS: -49 cm/s
RCCAPDIAS: 24 cm/s
RCCAPSYS: 79 cm/s
RIGHT ECA DIAS: 20 cm/s
RIGHT VERTEBRAL DIAS: 7 cm/s

## 2017-03-14 ENCOUNTER — Ambulatory Visit (INDEPENDENT_AMBULATORY_CARE_PROVIDER_SITE_OTHER): Payer: Medicare Other | Admitting: Diagnostic Neuroimaging

## 2017-03-14 ENCOUNTER — Encounter: Payer: Self-pay | Admitting: Diagnostic Neuroimaging

## 2017-03-14 VITALS — BP 118/77 | HR 84 | Wt 202.0 lb

## 2017-03-14 DIAGNOSIS — F0390 Unspecified dementia without behavioral disturbance: Secondary | ICD-10-CM | POA: Diagnosis not present

## 2017-03-14 DIAGNOSIS — F03B0 Unspecified dementia, moderate, without behavioral disturbance, psychotic disturbance, mood disturbance, and anxiety: Secondary | ICD-10-CM

## 2017-03-14 DIAGNOSIS — G40909 Epilepsy, unspecified, not intractable, without status epilepticus: Secondary | ICD-10-CM | POA: Diagnosis not present

## 2017-03-14 DIAGNOSIS — G912 (Idiopathic) normal pressure hydrocephalus: Secondary | ICD-10-CM | POA: Diagnosis not present

## 2017-03-14 DIAGNOSIS — I6330 Cerebral infarction due to thrombosis of unspecified cerebral artery: Secondary | ICD-10-CM

## 2017-03-14 MED ORDER — KEPPRA 750 MG PO TABS: 1500.0000 mg | ORAL_TABLET | Freq: Two times a day (BID) | ORAL | 4 refills | Status: DC

## 2017-03-14 NOTE — Progress Notes (Signed)
[facility]  PATIENT: Carlos Ballard DOB: [birth year]  REFERRING CLINICIAN:  HISTORY FROM: patient and daughter REASON FOR VISIT: follow up   HISTORICAL  CHIEF COMPLAINT:  Chief Complaint  Patient presents with  . Dementia    rm 6, dgtr- Myra, MMSE  16, "stroke on 02/14/17"  . Follow-up    one year    HISTORY OF PRESENT ILLNESS:   UPDATE 03/14/17: Since last visit, went to hospital in 02/15/17 for right sided weakness. Dx'd with small left pontine stroke. Staring spells continue every 6 weeks. No convulsions. Now back at home. Tolerating meds. Dementia stable. Could not tolerate memantine (? More staring spells).   UPDATE 01/19/16: Since last visit, staring spells continue. No convulsions. Memory stable. Daughter takes care of cooking, shopping, bills. Pt can maintain personal hygiene, make bed, get bowl of ice cream. He is on his own at home when daughter is at work. Not driving.   UPDATE 01/08/15: Since last visit, tripped and fell in Sept 2015, leading to right ankle / fibula fracture; treated at [facility] with a cast, no surgery. Otherwise stable. Having 2-5 staring spells per month, with mouth chewing, and possible seizure related. No grand mal or convulsive seizures.   UPDATE 12/16/13: Since last visit, doing well. No seizures. Memory loss slightly progressive. Daughter takes care of his meds, meals; patient still able to feed and dress himself. No wandering. No safety issues. No falls. Still on pradaxa. Living will is in place.  UPDATE 03/27/13: Since last visit, having one staring spell type seizure every 2-3 months. Last 2 events occurred in the middle the night, where patient's daughter found in the morning somewhat confused and complaining of sore muscles. No witnessed convulsions.  UPDATE 07/06/12:  Has had one witnessed seizure on 05/24/12 similar to previous per his daughter.  Tolerating Keppra 1500mg  BID (has to have brand). Denies lack of sleep,  dehydration, fever or skipped doses.  No changes in memory loss.  Seen neurosurgeon feels shunt is functioning well.    UPDATE 04/23/12: Returns today after having 2 seizures on 03/28/12 and 04/03/12.  Daughter reports they are similar to his previous seizures except he was disoriented a little longer.  Denies lack of sleep, dehydration, fever or skipped doses.  Denies any urinary symptoms, does have a history of kidney stones and urinary tract infections.  Taking Keppra 1000mg  BID, his daughter did give him an extra Keppra after each seizure.  Denies any neurologic symptoms.  No changes in memory loss.    UPDATE 12/13/11: No new complaints since last visit 02/01/11, states "everything is just fine".  He has not had any episodes of seizures and is tolerating Keppra well, no side effects.  Also tolerating Aricept well.  Denies any falls.  Continues to have urgency but not any worse and no incontinence.  No new stroke symptoms since his stroke in November 2010.  Continues on Pradaxa.  Short term memory remains poor but not worse.   PRIOR HPI: Patient returns today with his daughter for followup. I have followed him in this office for several years. He has a long history of epilepsy, for which he took Dilantin for many years, and more recently has been on Keppra. He tolerated Depakote poorly. He has had progressive memory problems going back about 5 years, and has been on Aricept. Earlier this year, he was diagnosed with normal pressure hydrocephalus on the basis of MRI findings and response to a high volume lumbar  puncture. He was scheduled to have a VP shunt placed by Dr. Annette Stable on 08/24/09, but developed acute LLE weakness on 08/20/09. Urgent MRI confirmed an acute stroke involving the subcortical white matter of the right precentral gyrus. At that point I placed him back on aspirin and advised that he delay surgery. Had VP shunt 11/03/09, improvement in gait. Last visit February. Since then he had an echo, which was  normal, and a Holter monitor which demonstrated paroxysmal atrial fibrillation.   REVIEW OF SYSTEMS: Full 14 system review of systems performed and negative except for: weakness.    ALLERGIES: Allergies  Allergen Reactions  . Depakote [Divalproex Sodium] Swelling and Other (See Comments)     low blood counts  . Phenytek [Phenytoin Sodium Extended] Other (See Comments)    ineffective    HOME MEDICATIONS: Outpatient Medications Prior to Visit  Medication Sig Dispense Refill  . atorvastatin (LIPITOR) 20 MG tablet Take 1 tablet (20 mg total) by mouth daily at 6 PM. 30 tablet 1  . dabigatran (PRADAXA) 150 MG CAPS capsule Take 1 capsule (150 mg total) by mouth 2 (two) times daily. 60 capsule 1  . diltiazem (CARDIZEM CD) 240 MG 24 hr capsule Take 240 mg by mouth daily.    Marland Kitchen donepezil (ARICEPT) 10 MG tablet Take 1 tablet (10 mg total) by mouth at bedtime. 90 tablet 4  . finasteride (PROSCAR) 5 MG tablet Take 5 mg by mouth every evening.     Marland Kitchen KEPPRA 750 MG tablet TAKE 2 TABLETS BY MOUTH TWO TIMES DAILY 360 tablet 0  . loratadine (CLARITIN) 10 MG tablet Take 10 mg by mouth at bedtime.    . Multiple Vitamins-Minerals (MULTIVITAMIN PO) Take 1 tablet by mouth daily.    . pantoprazole (PROTONIX) 40 MG tablet Take 40 mg by mouth daily as needed (for acid reflux).     . valsartan (DIOVAN) 320 MG tablet Take 320 mg by mouth daily.      . memantine (NAMENDA) 10 MG tablet Take 1 tablet (10 mg total) by mouth 2 (two) times daily. (Patient not taking: Reported on 02/15/2017) 60 tablet 12   No facility-administered medications prior to visit.     PAST MEDICAL HISTORY: Past Medical History:  Diagnosis Date  . AAA (abdominal aortic aneurysm) (HCC)    Incisional Hernia  . Abdominal pain   . Allergic rhinitis, cause unspecified   . Atrial fibrillation ([place])   . Bell's palsy   . BPH (benign prostatic hyperplasia)   . Cancer ([place])    colon  . Colon polyp    Malignant  . Dementia   . Frequency of  urination and polyuria   . Hyperlipidemia   . Hypertension   . Kidney stones   . Lumbago   . Malaise and fatigue   . Melanoma ([place])   . Obstructive hydrocephalus   . Seizure ([place]) 12/01  . Stroke ([place]) 02/14/2017    PAST SURGICAL HISTORY: Past Surgical History:  Procedure Laterality Date  . CHOLECYSTECTOMY    . COLON SURGERY    . COLONOSCOPY    . MELANOMA EXCISION  07/2008  . NASAL SINUS SURGERY    . POLYPECTOMY  11/2005    FAMILY HISTORY: Family History  Problem Relation Age of Onset  . Cancer Mother        ovarian cancer  . Hypertension Father     SOCIAL HISTORY:  Social History   Social History  . Marital status: Widowed    Spouse name:  N/A  . Number of children: 1  . Years of education: 12th   Occupational History  . Retired     Lawyer   Social History Main Topics  . Smoking status: Former Smoker    Years: 40.00    Types: Cigars    Quit date: 06/20/2009  . Smokeless tobacco: Never Used  . Alcohol use No  . Drug use: No  . Sexual activity: Not on file   Other Topics Concern  . Not on file   Social History Narrative   Pt lives at home with his daughter, [name].   Caffeine Use: 1/2 caff coffee- 1/2- 1 pot daily in winter     PHYSICAL EXAM  GENERAL EXAM/CONSTITUTIONAL: Vitals:  Vitals:   03/14/17 1443  BP: 118/77  Pulse: 84  Weight: 202 lb (91.6 kg)   Body mass index is 30.71 kg/m.  Patient is in no distress; well developed, nourished and groomed; neck is supple  CARDIOVASCULAR:  Examination of carotid arteries is normal; no carotid bruits  Regular rate and rhythm, no murmurs  Examination of peripheral vascular system by observation and palpation is normal  EYES:  CONJUNCTIVAL ERYTHEMA  Ophthalmoscopic exam of optic discs and posterior segments is normal; no papilledema or hemorrhages  MUSCULOSKELETAL:  Gait, strength, tone, movements noted in Neurologic exam below  NEUROLOGIC: MENTAL STATUS:  MMSE - Mini Mental State  Exam 03/14/2017 01/19/2016 01/08/2015  Orientation to time 1 1 2   Orientation to Place 3 3 4   Registration 3 3 3   Attention/ Calculation 1 1 1   Recall 0 0 0  Language- name 2 objects 2 2 2   Language- repeat 1 0 0  Language- follow 3 step command 3 3 3   Language- read & follow direction 1 1 1   Write a sentence 1 0 0  Copy design 0 0 0  Total score 16 14 16     awake, alert, oriented to person, place and time  recent and remote memory intact  normal attention and concentration  language fluent, comprehension intact, naming intact,   fund of knowledge appropriate  CRANIAL NERVE:   2nd - no papilledema on fundoscopic exam  2nd, 3rd, 4th, 6th - pupils equal and reactive to light, visual fields full to confrontation, extraocular muscles intact, no nystagmus  5th - facial sensation symmetric  7th - facial strength symmetric  8th - hearing intact  9th - palate elevates symmetrically, uvula midline  11th - shoulder shrug symmetric  12th - tongue protrusion midline  MOTOR:   normal bulk and tone, full strength in the BUE, BLE  FINGER TAPPING AND FOOT TAPPING SLOWER ON THE RIGHT  SENSORY:   normal and symmetric to light touch, temperature, vibration  COORDINATION:   finger-nose-finger, fine finger movements normal  REFLEXES:   deep tendon reflexes present and symmetric  GAIT/STATION:   narrow based gait; able to walk on toes, heels and tandem; romberg is negative  SHORT STEPS, STOOPED POSTURE.     DIAGNOSTIC DATA (LABS, IMAGING, TESTING) - I reviewed patient records, labs, notes, testing and imaging myself where available.  Lab Results  Component Value Date   WBC 5.5 02/16/2017   HGB 14.4 02/16/2017   HCT 43.0 02/16/2017   MCV 95.1 02/16/2017   PLT 185 02/16/2017      Component Value Date/Time   NA 141 02/16/2017 0407   K 3.5 02/16/2017 0407   CL 108 02/16/2017 0407   CO2 24 02/16/2017 0407   GLUCOSE 108 (H) 02/16/2017  0407   BUN 13 02/16/2017  0407   CREATININE 1.10 02/16/2017 0407   CALCIUM 9.1 02/16/2017 0407   PROT 6.3 (L) 02/15/2017 1046   ALBUMIN 3.7 02/15/2017 1046   AST 22 02/15/2017 1046   ALT 21 02/15/2017 1046   ALKPHOS 55 02/15/2017 1046   BILITOT 1.0 02/15/2017 1046   GFRNONAA 58 (L) 02/16/2017 0407   GFRAA >60 02/16/2017 0407   Lipid Panel     Component Value Date/Time   CHOL 151 02/15/2017 0723   TRIG 296 (H) 02/15/2017 0723   HDL 32 (L) 02/15/2017 0723   CHOLHDL 4.7 02/15/2017 0723   VLDL 59 (H) 02/15/2017 0723   LDLCALC 60 02/15/2017 0723   Lab Results  Component Value Date   HGBA1C 5.7 (H) 02/15/2017   No results found for: VITAMINB12 No results found for: TSH  04/24/2012 MRI brain  - shows diffuse pachymeningeal enhancement.  May be due to intracranial hypotension associated with ventricular shunt. Focal T1 hyperintense, ependymal lesion in the left anterior frontal horn, may be fatty or proteinaceous cyst.   02/15/17 MRI brain [I reviewed images myself and agree with interpretation. -VRP]  1. Small approximate 1 cm focus of patchy diffusion abnormality within the central/left central aspect of the inferior ventral pons, suspicious for small acute ischemic infarct. No associated hemorrhage or mass effect. 2. No other acute intracranial process identified. 3. Right parietal ventriculostomy catheter in stable position. Stable ventricular size. 4. Bilateral subdural collections with extension about the cerebellum and brainstem, likely due to chronic shunting. No evidence for acute intracranial hemorrhage. 5. Small remote right cerebellar infarct. 6. Generalized cerebral atrophy with mild chronic small vessel ischemic disease.  02/15/17 MRA HEAD[I reviewed images myself and agree with interpretation. -VRP]  1. Motion degraded exam. No definite large vessel occlusion or high-grade stenosis. 2. Hypoplastic right vertebral artery terminates in PICA. Vertebrobasilar system otherwise  patent.  02/16/17 carotid u/s - The vertebral arteries appear patent with antegrade flow. - Findings consistent with 1-39 percent stenosis involving the   right internal carotid artery and the left internal carotid   artery.  02/16/17 TTE - Left ventricle: The cavity size was normal. Wall thickness was   increased in a pattern of moderate LVH. Systolic function was   normal. The estimated ejection fraction was in the range of 60%   to 65%. Doppler parameters are consistent with abnormal left   ventricular relaxation (grade 1 diastolic dysfunction).     ASSESSMENT AND PLAN  83 y.o. male with dementia, NPH and seizure disorder.   Has staring spells, possible complex partial seizures.  On Keppra 1500mg  BID. No witnessed convulsions in several years. Therefore will continue current Keppra only.   Memory loss slightly progressing on donepezil. Could not tolerate memantine.   Dx: seizure disorder + moderate dementia + NPH  Cerebrovascular accident (CVA) due to thrombosis of cerebral artery (HCC)  Moderate dementia without behavioral disturbance  NPH (normal pressure hydrocephalus)  Seizure disorder (HCC)    PLAN:  STROKE, left pontine, small vessel thrombosis (new problem, no additional workup) - continue pradaxa, diltiazem, valsartan, atorvastatin  SEIZURE (established problem, stable) - continue LEV 1500mg  BID (brand medically necessary)  DEMENTIA / NPH (established problem, stable) - continue donepezil 10mg  daily - caution with fall risk, unsupervised time, and progressive dementia  Meds ordered this encounter  Medications  . KEPPRA 750 MG tablet    Sig: Take 2 tablets (1,500 mg total) by mouth 2 (two) times daily. BRAND MEDICALLY NECESSARY  Dispense:  360 tablet    Refill:  4    Pt needs to call [phone] to schedule his yearly follow up appt.   Return in about 1 year (around 03/14/2018).     Penni Bombard, MD 8/00/3491, 7:91 PM Certified in Neurology,  Neurophysiology and Neuroimaging  [facility] [address], [place], [place] [address] [phone]

## 2017-03-20 ENCOUNTER — Other Ambulatory Visit: Payer: Self-pay | Admitting: Diagnostic Neuroimaging

## 2018-03-14 ENCOUNTER — Encounter: Payer: Self-pay | Admitting: Diagnostic Neuroimaging

## 2018-03-14 ENCOUNTER — Ambulatory Visit: Payer: Medicare Other | Admitting: Diagnostic Neuroimaging

## 2018-03-14 VITALS — BP 127/85 | HR 96 | Ht 68.0 in | Wt 196.6 lb

## 2018-03-14 DIAGNOSIS — F03B0 Unspecified dementia, moderate, without behavioral disturbance, psychotic disturbance, mood disturbance, and anxiety: Secondary | ICD-10-CM

## 2018-03-14 DIAGNOSIS — F0390 Unspecified dementia without behavioral disturbance: Secondary | ICD-10-CM

## 2018-03-14 DIAGNOSIS — G40909 Epilepsy, unspecified, not intractable, without status epilepticus: Secondary | ICD-10-CM | POA: Diagnosis not present

## 2018-03-14 DIAGNOSIS — I6330 Cerebral infarction due to thrombosis of unspecified cerebral artery: Secondary | ICD-10-CM | POA: Diagnosis not present

## 2018-03-14 NOTE — Progress Notes (Signed)
[facility]  PATIENT: Carlos Ballard DOB: August 03, 1928  REFERRING CLINICIAN:  HISTORY FROM: patient and daughter REASON FOR VISIT: follow up   HISTORICAL  CHIEF COMPLAINT:  Chief Complaint  Patient presents with  . Dementia  . Seizures    HISTORY OF PRESENT ILLNESS:   UPDATE (03/14/18, VRP): Since last visit, doing well. Tolerating meds. No alleviating or aggravating factors. Dementia continues. 5-10 seizures in the last year.   UPDATE 03/14/17: Since last visit, went to hospital in 02/15/17 for right sided weakness. Dx'd with small left pontine stroke. Staring spells continue every 6 weeks. No convulsions. Now back at home. Tolerating meds. Dementia stable. Could not tolerate memantine (? More staring spells).   UPDATE 01/19/16: Since last visit, staring spells continue. No convulsions. Memory stable. Daughter takes care of cooking, shopping, bills. Pt can maintain personal hygiene, make bed, get bowl of ice cream. He is on his own at home when daughter is at work. Not driving.   UPDATE 01/08/15: Since last visit, tripped and fell in Sept 2015, leading to right ankle / fibula fracture; treated at [place] with a cast, no surgery. Otherwise stable. Having 2-5 staring spells per month, with mouth chewing, and possible seizure related. No grand mal or convulsive seizures.   UPDATE 12/16/13: Since last visit, doing well. No seizures. Memory loss slightly progressive. Daughter takes care of his meds, meals; patient still able to feed and dress himself. No wandering. No safety issues. No falls. Still on pradaxa. Living will is in place.  UPDATE 03/27/13: Since last visit, having one staring spell type seizure every 2-3 months. Last 2 events occurred in the middle the night, where patient's daughter found in the morning somewhat confused and complaining of sore muscles. No witnessed convulsions.  UPDATE 07/06/12:  Has had one witnessed seizure on 05/24/12 similar to  previous per his daughter.  Tolerating Keppra 1500mg  BID (has to have brand). Denies lack of sleep, dehydration, fever or skipped doses.  No changes in memory loss.  Seen neurosurgeon feels shunt is functioning well.    UPDATE 04/23/12: Returns today after having 2 seizures on 03/28/12 and 04/03/12.  Daughter reports they are similar to his previous seizures except he was disoriented a little longer.  Denies lack of sleep, dehydration, fever or skipped doses.  Denies any urinary symptoms, does have a history of kidney stones and urinary tract infections.  Taking Keppra 1000mg  BID, his daughter did give him an extra Keppra after each seizure.  Denies any neurologic symptoms.  No changes in memory loss.    UPDATE 12/13/11: No new complaints since last visit 02/01/11, states "everything is just fine".  He has not had any episodes of seizures and is tolerating Keppra well, no side effects.  Also tolerating Aricept well.  Denies any falls.  Continues to have urgency but not any worse and no incontinence.  No new stroke symptoms since his stroke in November 2010.  Continues on Pradaxa.  Short term memory remains poor but not worse.   PRIOR HPI: Patient returns today with his daughter for followup. I have followed him in this office for several years. He has a long history of epilepsy, for which he took Dilantin for many years, and more recently has been on Keppra. He tolerated Depakote poorly. He has had progressive memory problems going back about 5 years, and has been on Aricept. Earlier this year, he was diagnosed with normal pressure hydrocephalus on the basis of MRI findings  and response to a high volume lumbar puncture. He was scheduled to have a VP shunt placed by Dr. Annette Stable on 08/24/09, but developed acute LLE weakness on 08/20/09. Urgent MRI confirmed an acute stroke involving the subcortical white matter of the right precentral gyrus. At that point I placed him back on aspirin and advised that he delay surgery. Had  VP shunt 11/03/09, improvement in gait. Last visit February. Since then he had an echo, which was normal, and a Holter monitor which demonstrated paroxysmal atrial fibrillation.   REVIEW OF SYSTEMS: Full 14 system review of systems performed and negative except for: seizure weakness memory loss.    ALLERGIES: Allergies  Allergen Reactions  . Depakote [Divalproex Sodium] Swelling and Other (See Comments)     low blood counts  . Phenytek [Phenytoin Sodium Extended] Other (See Comments)    ineffective    HOME MEDICATIONS: Outpatient Medications Prior to Visit  Medication Sig Dispense Refill  . atorvastatin (LIPITOR) 20 MG tablet Take 1 tablet (20 mg total) by mouth daily at 6 PM. 30 tablet 1  . dabigatran (PRADAXA) 150 MG CAPS capsule Take 1 capsule (150 mg total) by mouth 2 (two) times daily. (Patient taking differently: Take 75 mg by mouth 2 (two) times daily. ) 60 capsule 1  . diltiazem (CARDIZEM CD) 240 MG 24 hr capsule Take 240 mg by mouth daily.    Marland Kitchen donepezil (ARICEPT) 10 MG tablet Take 1 tablet (10 mg total) by mouth at bedtime. 90 tablet 4  . finasteride (PROSCAR) 5 MG tablet Take 5 mg by mouth every evening.     Marland Kitchen KEPPRA 750 MG tablet TAKE 2 TABLETS BY MOUTH TWO TIMES DAILY 360 tablet 4  . loratadine (CLARITIN) 10 MG tablet Take 10 mg by mouth at bedtime.    . Multiple Vitamins-Minerals (MULTIVITAMIN PO) Take 1 tablet by mouth daily.    . pantoprazole (PROTONIX) 40 MG tablet Take 40 mg by mouth daily as needed (for acid reflux).     . valsartan (DIOVAN) 320 MG tablet Take 320 mg by mouth daily.       No facility-administered medications prior to visit.     PAST MEDICAL HISTORY: Past Medical History:  Diagnosis Date  . AAA (abdominal aortic aneurysm) (HCC)    Incisional Hernia  . Abdominal pain   . Allergic rhinitis, cause unspecified   . Atrial fibrillation ([place])   . Bell's palsy   . BPH (benign prostatic hyperplasia)   . Cancer ([place])    colon  . Colon polyp     Malignant  . Dementia   . Frequency of urination and polyuria   . Hyperlipidemia   . Hypertension   . Kidney stones   . Lumbago   . Malaise and fatigue   . Melanoma ([place])   . Obstructive hydrocephalus   . Seizure ([place]) 12/01  . Stroke ([place]) 02/14/2017    PAST SURGICAL HISTORY: Past Surgical History:  Procedure Laterality Date  . CHOLECYSTECTOMY    . COLON SURGERY    . COLONOSCOPY    . MELANOMA EXCISION  07/2008  . NASAL SINUS SURGERY    . POLYPECTOMY  11/2005    FAMILY HISTORY: Family History  Problem Relation Age of Onset  . Cancer Mother        ovarian cancer  . Hypertension Father     SOCIAL HISTORY:  Social History   Socioeconomic History  . Marital status: Widowed    Spouse name: Not on file  .  Number of children: 1  . Years of education: 12th  . Highest education level: Not on file  Occupational History  . Occupation: Retired    Comment: Lawyer  Social Needs  . Financial resource strain: Not on file  . Food insecurity:    Worry: Not on file    Inability: Not on file  . Transportation needs:    Medical: Not on file    Non-medical: Not on file  Tobacco Use  . Smoking status: Former Smoker    Years: 40.00    Types: Cigars    Last attempt to quit: 06/20/2009    Years since quitting: 8.7  . Smokeless tobacco: Never Used  Substance and Sexual Activity  . Alcohol use: No  . Drug use: No  . Sexual activity: Not on file  Lifestyle  . Physical activity:    Days per week: Not on file    Minutes per session: Not on file  . Stress: Not on file  Relationships  . Social connections:    Talks on phone: Not on file    Gets together: Not on file    Attends religious service: Not on file    Active member of club or organization: Not on file    Attends meetings of clubs or organizations: Not on file    Relationship status: Not on file  . Intimate partner violence:    Fear of current or ex partner: Not on file    Emotionally abused: Not on file     Physically abused: Not on file    Forced sexual activity: Not on file  Other Topics Concern  . Not on file  Social History Narrative   Pt lives at home with his daughter, [name].   Caffeine Use: 1/2 caff coffee- 1/2- 1 pot daily in winter     PHYSICAL EXAM  GENERAL EXAM/CONSTITUTIONAL: Vitals:  Vitals:   03/14/18 1414  BP: 127/85  Pulse: 96  Weight: 196 lb 9.6 oz (89.2 kg)  Height: 5\' 8"  (1.727 m)   Body mass index is 30.71 kg/m.  Patient is in no distress; well developed, nourished and groomed; neck is supple  CARDIOVASCULAR:  Examination of carotid arteries is normal; no carotid bruits  IRREGULAR RATE AND RHYTHM; no murmurs  Examination of peripheral vascular system by observation and palpation is normal  EYES:  CONJUNCTIVAL ERYTHEMA  Ophthalmoscopic exam of optic discs and posterior segments is normal; no papilledema or hemorrhages  MUSCULOSKELETAL:  Gait, strength, tone, movements noted in Neurologic exam below  NEUROLOGIC: MENTAL STATUS:  MMSE - Mini Mental State Exam 03/14/2017 01/19/2016 01/08/2015  Orientation to time 1 1 2   Orientation to Place 3 3 4   Registration 3 3 3   Attention/ Calculation 1 1 1   Recall 0 0 0  Language- name 2 objects 2 2 2   Language- repeat 1 0 0  Language- follow 3 step command 3 3 3   Language- read & follow direction 1 1 1   Write a sentence 1 0 0  Copy design 0 0 0  Total score 16 14 16     awake, alert, oriented to person, place and time  recent and remote memory intact  normal attention and concentration  language fluent, comprehension intact, naming intact,   fund of knowledge appropriate  CRANIAL NERVE:   2nd - no papilledema on fundoscopic exam  2nd, 3rd, 4th, 6th - pupils equal and reactive to light, visual fields full to confrontation, extraocular muscles intact, no nystagmus  5th -  facial sensation symmetric  7th - facial strength symmetric  8th - hearing intact  9th - palate elevates symmetrically,  uvula midline  11th - shoulder shrug symmetric  12th - tongue protrusion midline  MOTOR:   normal bulk and tone, full strength in the BUE, BLE  FINGER TAPPING AND FOOT TAPPING SLOWER ON THE RIGHT  SENSORY:   normal and symmetric to light touch, temperature, vibration  COORDINATION:   finger-nose-finger, fine finger movements normal  REFLEXES:   deep tendon reflexes present and symmetric  GAIT/STATION:   narrow based gait; able to walk on toes, heels and tandem; romberg is negative  SHORT STEPS, STOOPED POSTURE.     DIAGNOSTIC DATA (LABS, IMAGING, TESTING) - I reviewed patient records, labs, notes, testing and imaging myself where available.  Lab Results  Component Value Date   WBC 5.5 02/16/2017   HGB 14.4 02/16/2017   HCT 43.0 02/16/2017   MCV 95.1 02/16/2017   PLT 185 02/16/2017      Component Value Date/Time   NA 141 02/16/2017 0407   K 3.5 02/16/2017 0407   CL 108 02/16/2017 0407   CO2 24 02/16/2017 0407   GLUCOSE 108 (H) 02/16/2017 0407   BUN 13 02/16/2017 0407   CREATININE 1.10 02/16/2017 0407   CALCIUM 9.1 02/16/2017 0407   PROT 6.3 (L) 02/15/2017 1046   ALBUMIN 3.7 02/15/2017 1046   AST 22 02/15/2017 1046   ALT 21 02/15/2017 1046   ALKPHOS 55 02/15/2017 1046   BILITOT 1.0 02/15/2017 1046   GFRNONAA 58 (L) 02/16/2017 0407   GFRAA >60 02/16/2017 0407   Lipid Panel     Component Value Date/Time   CHOL 151 02/15/2017 0723   TRIG 296 (H) 02/15/2017 0723   HDL 32 (L) 02/15/2017 0723   CHOLHDL 4.7 02/15/2017 0723   VLDL 59 (H) 02/15/2017 0723   LDLCALC 60 02/15/2017 0723   Lab Results  Component Value Date   HGBA1C 5.7 (H) 02/15/2017   No results found for: VITAMINB12 No results found for: TSH  04/24/2012 MRI brain  - shows diffuse pachymeningeal enhancement.  May be due to intracranial hypotension associated with ventricular shunt. Focal T1 hyperintense, ependymal lesion in the left anterior frontal horn, may be fatty or proteinaceous  cyst.   02/15/17 MRI brain [I reviewed images myself and agree with interpretation. -VRP]  1. Small approximate 1 cm focus of patchy diffusion abnormality within the central/left central aspect of the inferior ventral pons, suspicious for small acute ischemic infarct. No associated hemorrhage or mass effect. 2. No other acute intracranial process identified. 3. Right parietal ventriculostomy catheter in stable position. Stable ventricular size. 4. Bilateral subdural collections with extension about the cerebellum and brainstem, likely due to chronic shunting. No evidence for acute intracranial hemorrhage. 5. Small remote right cerebellar infarct. 6. Generalized cerebral atrophy with mild chronic small vessel ischemic disease.  02/15/17 MRA HEAD[I reviewed images myself and agree with interpretation. -VRP]  1. Motion degraded exam. No definite large vessel occlusion or high-grade stenosis. 2. Hypoplastic right vertebral artery terminates in PICA. Vertebrobasilar system otherwise patent.  02/16/17 carotid u/s - The vertebral arteries appear patent with antegrade flow. - Findings consistent with 1-39 percent stenosis involving the   right internal carotid artery and the left internal carotid   artery.  02/16/17 TTE - Left ventricle: The cavity size was normal. Wall thickness was   increased in a pattern of moderate LVH. Systolic function was   normal. The estimated ejection fraction was  in the range of 60%   to 65%. Doppler parameters are consistent with abnormal left   ventricular relaxation (grade 1 diastolic dysfunction).     ASSESSMENT AND PLAN  84 y.o. male with dementia, NPH and seizure disorder.   Has staring spells, possible complex partial seizures.  On Keppra 1500mg  BID. No witnessed convulsions in several years. Therefore will continue current Keppra only.   Memory loss slightly progressing on donepezil. Could not tolerate memantine.   Dx: seizure disorder + moderate  dementia + NPH  Moderate dementia without behavioral disturbance  Seizure disorder ([place])  Cerebrovascular accident (CVA) due to thrombosis of cerebral artery ([place])    PLAN:  SEIZURE (established problem, stable) - continue LEV 1500mg  twice a day (brand medically necessary)  DEMENTIA / NPH (established problem, stable) - continue donepezil 10mg  daily - caution with fall risk, unsupervised time, and progressive dementia  STROKE, left pontine, small vessel thrombosis - continue pradaxa (for atrial fibrillation), diltiazem, valsartan, atorvastatin  Meds ordered this encounter  Medications  . KEPPRA 750 MG tablet    Sig: Take 2 tablets (1,500 mg total) by mouth 2 (two) times daily. Brand medically necessary    Dispense:  360 tablet    Refill:  4  . donepezil (ARICEPT) 10 MG tablet    Sig: Take 1 tablet (10 mg total) by mouth at bedtime.    Dispense:  90 tablet    Refill:  4   Return in about 1 year (around 03/15/2019) for with NP.     Penni Bombard, MD 07/07/1938, 7:40 PM Certified in Neurology, Neurophysiology and [facility] [address], [place],  [address] [phone]

## 2018-03-15 MED ORDER — DONEPEZIL HCL 10 MG PO TABS: 10.0000 mg | ORAL_TABLET | Freq: Every day | ORAL | 4 refills | Status: DC

## 2018-03-15 MED ORDER — KEPPRA 750 MG PO TABS: 1500.0000 mg | ORAL_TABLET | Freq: Two times a day (BID) | ORAL | 4 refills | Status: AC

## 2018-07-19 IMAGING — MR MR HEAD W/O CM
9 of 11 series · 28 of 48 positions shown · non-contrast
Comparison: Prior CT from 02/14/2017.

CLINICAL DATA: Initial evaluation for acute slurred speech,
right-sided weakness.

EXAM:
MRI HEAD WITHOUT CONTRAST
MRA HEAD WITHOUT CONTRAST
TECHNIQUE: Multiplanar, multiecho pulse sequences of the brain and surrounding
structures were obtained without intravenous contrast. Angiographic
images of the head were obtained using MRA technique without
contrast.

[Series 3: DWI · axial · 3.0mm · 1.09mm/px · z∈[-75,+71]mm · 6 of 99 slices shown (1 of 4)]
[im 1/99]
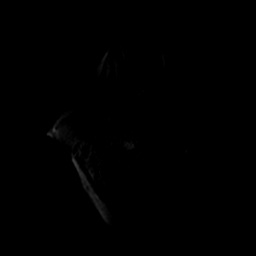
[im 20/99]
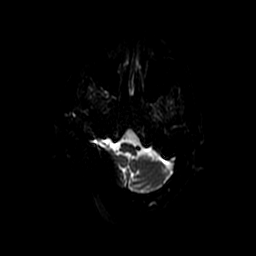
[im 40/99]
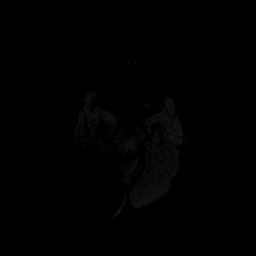
[im 59/99]
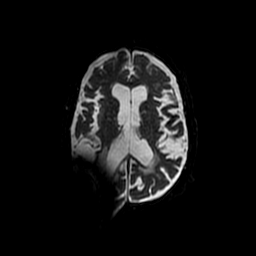
[im 79/99]
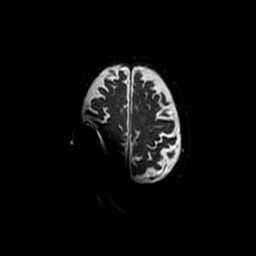
[im 99/99]
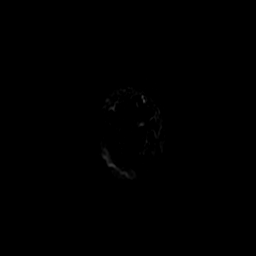

[Series 4: T1 · sagittal · 5.0mm · 0.47mm/px · 2 of 23 slices shown]
[im 1/23]
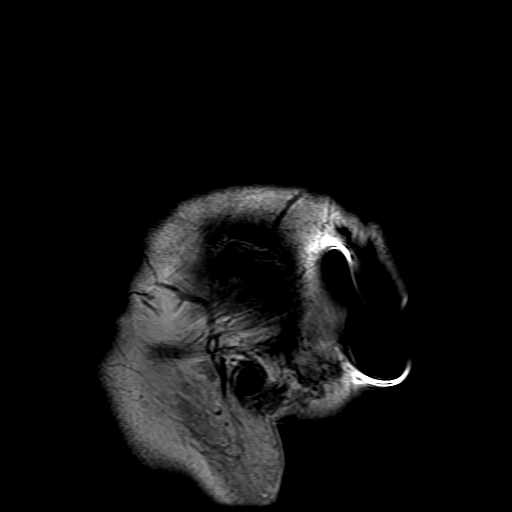
[im 23/23]
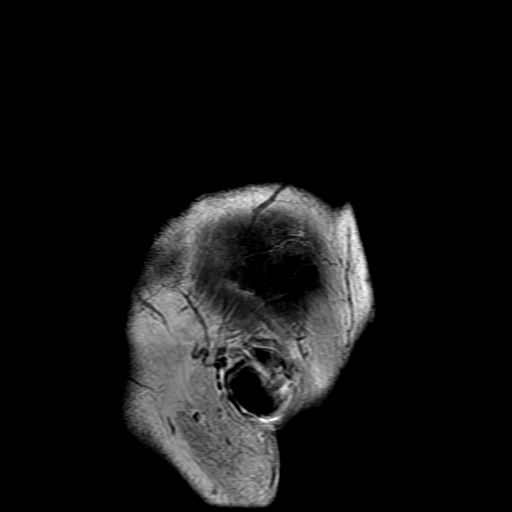

[Series 5: T2 · axial · 5.0mm · 0.43mm/px · z∈[-75,+69]mm · 2 of 25 slices shown (1 of 2)]
[im 1/25]
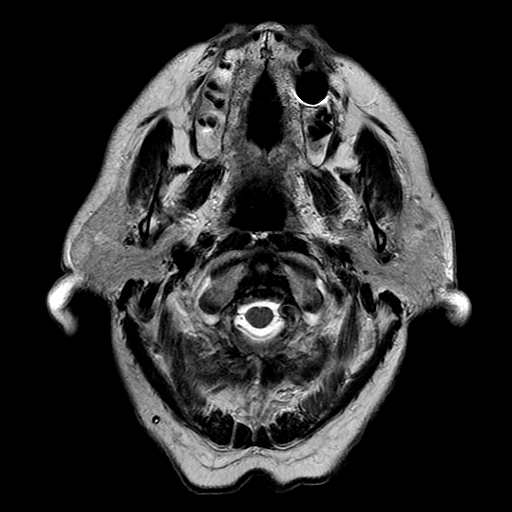
[im 25/25]
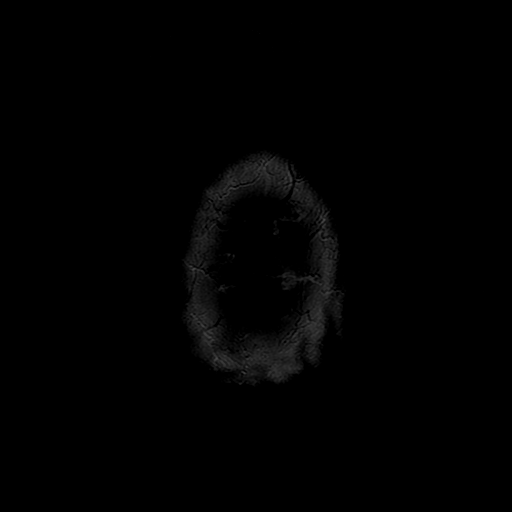

[Series 6: (id) mt fs · axial · 1.4mm · 0.43mm/px · z∈[-48,-30]mm · 2 of 168 slices shown]
[im 1/168]
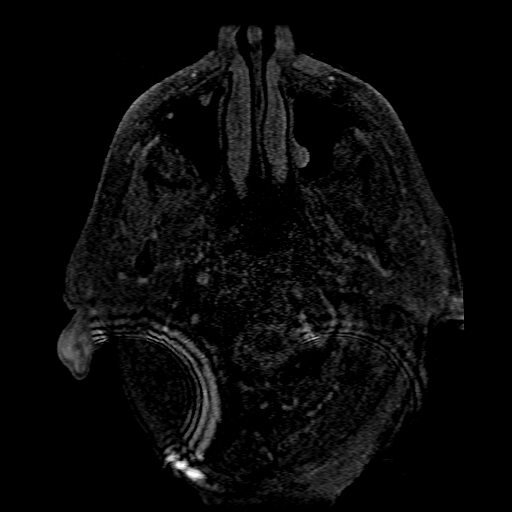
[im 28/168]
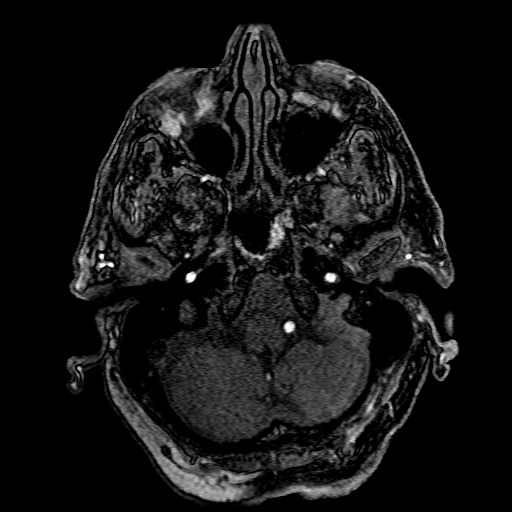

[Series 7: DWI · coronal · 5.0mm · 1.09mm/px · 5 of 68 slices shown (2 of 4)]
[im 1/68]
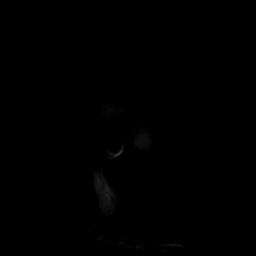
[im 17/68]
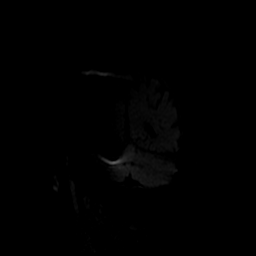
[im 34/68]
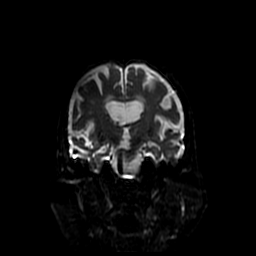
[im 51/68]
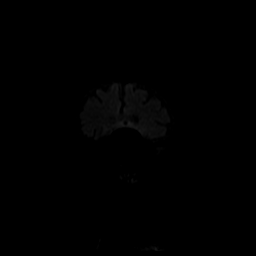
[im 68/68]
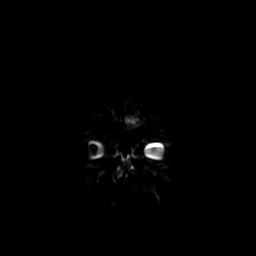

[Series 8: FLAIR · axial · 5.0mm · 0.43mm/px · z∈[-75,+69]mm · 2 of 25 slices shown]
[im 1/25]
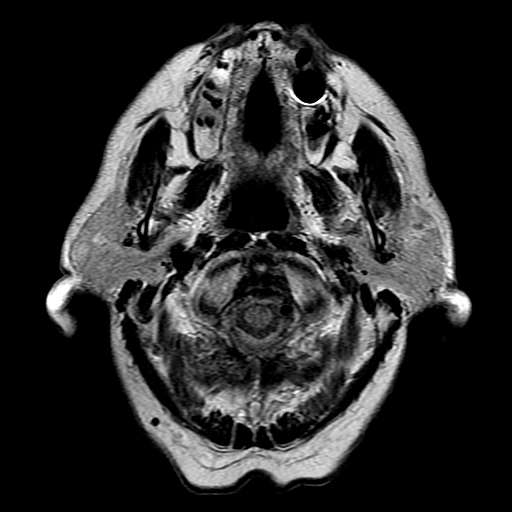
[im 25/25]
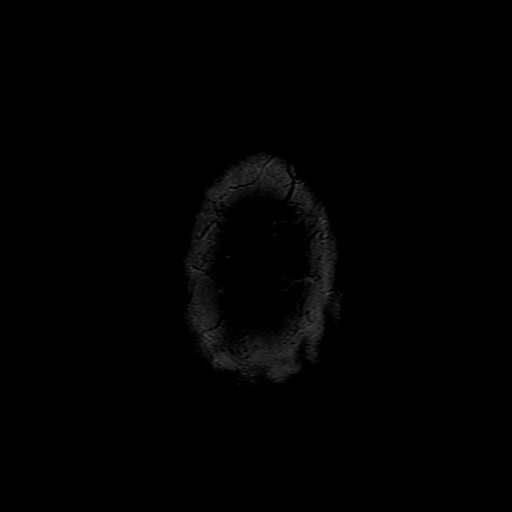

[Series 11: T2 · coronal · 5.0mm · 0.39mm/px · 2 of 27 slices shown (2 of 2)]
[im 1/27]
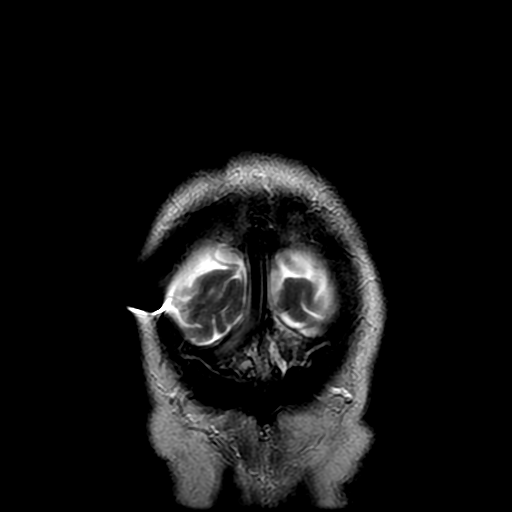
[im 27/27]
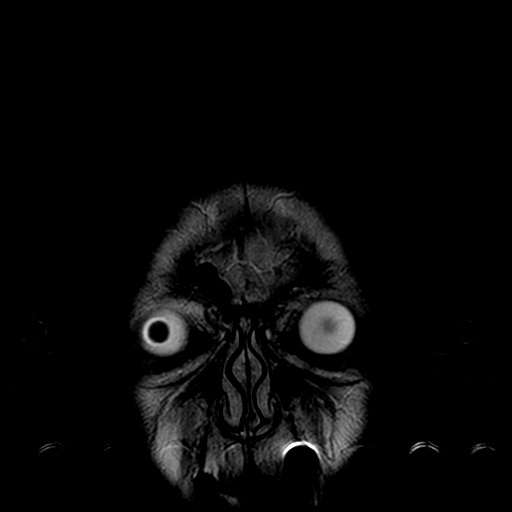

[Series 300: DWI · axial · 3.0mm · 1.09mm/px · z∈[-75,+71]mm · 4 of 50 slices shown (3 of 4)]
[im 1/50]
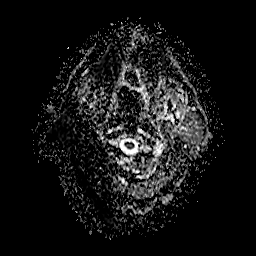
[im 17/50]
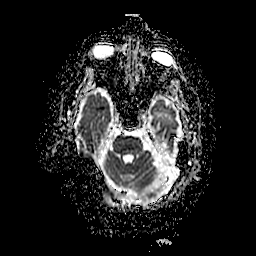
[im 33/50]
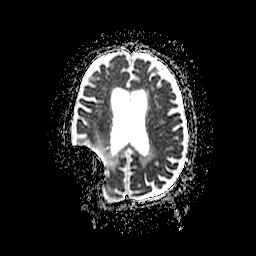
[im 50/50]
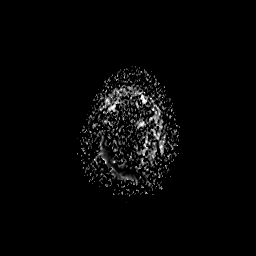

[Series 700: DWI · coronal · 5.0mm · 1.09mm/px · 3 of 35 slices shown (4 of 4)]
[im 1/35]
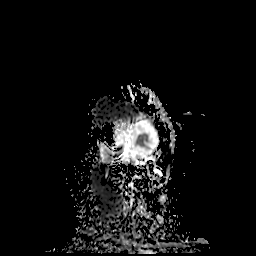
[im 18/35]
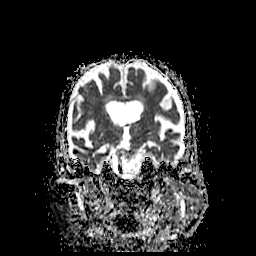
[im 35/35]
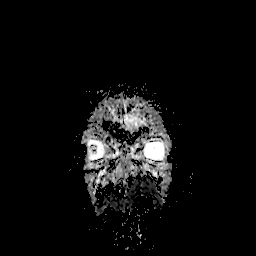

[28 of 48 positions shown; findings below may reference images not displayed]

FINDINGS: MRI HEAD FINDINGS

Brain: Diffuse prominence of the CSF containing spaces is compatible
with generalized age-related cerebral atrophy. Patchy and confluent
T2/FLAIR hyperintensity within the periventricular and deep white
matter both cerebral hemispheres most consistent with chronic
microvascular ischemic changes. Chronic microvascular ischemic
disease present within the pons is well. Overall, changes mild for
age. Small remote right cerebellar infarct noted.

Acute appy why sequence mildly limited from susceptibility fact from
right parietal shunt. There is patchy diffusion signal abnormality
within the ventral aspect of the inferior pons, suspicious for a
small acute ischemic infarct (series 3, image 14). This is
central/left paracentral in location. Area of infarct measures
approximately 10 x 8 mm. No associated mass effect or hemorrhage. No
other evidence for acute or subacute ischemia. Gray-white matter
differentiation otherwise maintained. No evidence for acute
intracranial hemorrhage. Small punctate focus of susceptibility fact
within the left medulla most consistent with a small chronic
microhemorrhage (series 9, image 6).

Right parietal shunt catheter in place with tip terminating near the
foramen of [place]. Ventricle size is stable. Encephalomalacia with
gliosis and chronic hemorrhagic blood products noted along the shunt
catheter tract in the right parietal region. Chronic appearing
bilateral subdural hematoma is measure up to 9 mm bilaterally,
likely related to chronic shunting. Subdural collections extend
around the cerebellum and brainstem/cervicomedullary junction as
well. No mass lesion or midline shift.

The pituitary gland and suprasellar region within normal limits.

Vascular: Major intracranial vascular flow voids are maintained.

Skull and upper cervical spine: Craniocervical junction within
normal limits. Visualized upper cervical spine unremarkable. Bone
marrow signal intensity within normal limits. No acute scalp soft
tissue abnormality. Right parieto-occipital scalp obscured by
susceptibility artifact from shunt catheter.

Sinuses/Orbits: Globes and orbital soft tissues within normal
limits. Small retention cyst noted within the left maxillary sinus.
Paranasal sinuses are otherwise clear. Trace bilateral mastoid
effusions. Inner ear structures normal.

Other: No other significant finding.

MRA HEAD FINDINGS

ANTERIOR CIRCULATION:

Study limited by motion artifact.

Distal cervical segments of the internal carotid arteries are patent
with antegrade flow. Distal cervical left ICA mildly tortuous.
Petrous, cavernous, supraclinoid segments patent without
flow-limiting stenosis. A1 segments patent. Anterior communicating
artery not well seen on this motion degraded exam. Anterior cerebral
arteries patent to their distal aspects.

M1 segments grossly patent without stenosis or occlusion, although
evaluation of the mid and distal M1 segments limited due to motion.
MCA bifurcations grossly normal. Proximal M2 branches not well
evaluated on this motion degraded exam. Distal MCA branches well
opacified and symmetric. Distal small vessel atheromatous
irregularity noted.

POSTERIOR CIRCULATION:

Left vertebral artery dominant and widely patent to the
vertebrobasilar junction. Right vertebral artery hypoplastic and
terminates in PICA. Left PICA not visualized. Basilar artery mildly
tortuous but widely patent to its distal aspect. Origin of the
superior cerebral arteries patent bilaterally. PCAs appear to be
supplied via the basilar artery bilaterally. PCAs patent at their
origins. P2 segments not well evaluated on this motion degraded
exam, although flow is seen within the distal PCA segments. No
obvious vascular occlusion.

No definite aneurysm or vascular malformation.
IMPRESSION: MRI HEAD IMPRESSION:

1. Small approximate 1 cm focus of patchy diffusion abnormality
within the central/left central aspect of the inferior ventral pons,
suspicious for small acute ischemic infarct. No associated
hemorrhage or mass effect.
2. No other acute intracranial process identified.
3. Right parietal ventriculostomy catheter in stable position.
Stable ventricular size.
4. Bilateral subdural collections with extension about the
cerebellum and brainstem, likely due to chronic shunting. No
evidence for acute intracranial hemorrhage.
5. Small remote right cerebellar infarct.
6. Generalized cerebral atrophy with mild chronic small vessel
ischemic disease.

MRA HEAD IMPRESSION:

1. Motion degraded exam. No definite large vessel occlusion or
high-grade stenosis.
2. Hypoplastic right vertebral artery terminates in PICA.
Vertebrobasilar system otherwise patent.

## 2019-03-19 ENCOUNTER — Telehealth: Payer: Self-pay | Admitting: *Deleted

## 2019-03-19 NOTE — Telephone Encounter (Signed)
Called daughter, [name] on DPR to discuss that due to current COVID 19 pandemic, our office is severely reducing in person visits in order to minimize the risk to our patients and healthcare providers. We recommend to convert her father's  appointment to a video visit.  No answer and VM not available.

## 2019-03-19 NOTE — Telephone Encounter (Signed)
Reached daughter, [name] and advised her that due to current COVID 19 pandemic, our office is severely reducing in person visits in order to minimize the risk to our patients and healthcare providers. We recommend to convert her fahter's appointment to a video visit. She stated that his PCP is taking over prescribing medications, and she requested to FU be canceled. I advised her we'll be glad to see patient at any time in future if needed. She verbalized understanding, appreciation.

## 2019-03-20 ENCOUNTER — Ambulatory Visit: Payer: Medicare Other | Admitting: Diagnostic Neuroimaging

## 2019-04-21 ENCOUNTER — Other Ambulatory Visit: Payer: Self-pay | Admitting: Diagnostic Neuroimaging

## 2019-05-28 ENCOUNTER — Other Ambulatory Visit: Payer: Self-pay | Admitting: Neurosurgery

## 2019-05-28 DIAGNOSIS — G919 Hydrocephalus, unspecified: Secondary | ICD-10-CM

## 2019-06-11 ENCOUNTER — Ambulatory Visit
Admission: RE | Admit: 2019-06-11 | Discharge: 2019-06-11 | Disposition: A | Discharge status: Home / Self Care | Payer: Medicare Other | Source: Ambulatory Visit | Attending: Neurosurgery | Admitting: Neurosurgery

## 2019-06-11 DIAGNOSIS — G919 Hydrocephalus, unspecified: Secondary | ICD-10-CM

## 2020-03-31 DIAGNOSIS — Z299 Encounter for prophylactic measures, unspecified: Secondary | ICD-10-CM | POA: Diagnosis not present

## 2020-03-31 DIAGNOSIS — G309 Alzheimer's disease, unspecified: Secondary | ICD-10-CM | POA: Diagnosis not present

## 2020-03-31 DIAGNOSIS — I4891 Unspecified atrial fibrillation: Secondary | ICD-10-CM | POA: Diagnosis not present

## 2020-03-31 DIAGNOSIS — N390 Urinary tract infection, site not specified: Secondary | ICD-10-CM | POA: Diagnosis not present

## 2020-04-09 DIAGNOSIS — Z0000 Encounter for general adult medical examination without abnormal findings: Secondary | ICD-10-CM | POA: Diagnosis not present

## 2020-04-09 DIAGNOSIS — E7800 Pure hypercholesterolemia, unspecified: Secondary | ICD-10-CM | POA: Diagnosis not present

## 2020-04-09 DIAGNOSIS — Z79899 Other long term (current) drug therapy: Secondary | ICD-10-CM | POA: Diagnosis not present

## 2020-04-09 DIAGNOSIS — Z299 Encounter for prophylactic measures, unspecified: Secondary | ICD-10-CM | POA: Diagnosis not present

## 2020-04-09 DIAGNOSIS — R5383 Other fatigue: Secondary | ICD-10-CM | POA: Diagnosis not present

## 2020-04-09 DIAGNOSIS — E559 Vitamin D deficiency, unspecified: Secondary | ICD-10-CM | POA: Diagnosis not present

## 2020-04-09 DIAGNOSIS — Z1211 Encounter for screening for malignant neoplasm of colon: Secondary | ICD-10-CM | POA: Diagnosis not present

## 2020-04-09 DIAGNOSIS — Z7189 Other specified counseling: Secondary | ICD-10-CM | POA: Diagnosis not present

## 2020-04-27 DIAGNOSIS — I4891 Unspecified atrial fibrillation: Secondary | ICD-10-CM | POA: Diagnosis not present

## 2020-04-27 DIAGNOSIS — Z299 Encounter for prophylactic measures, unspecified: Secondary | ICD-10-CM | POA: Diagnosis not present

## 2020-04-27 DIAGNOSIS — I10 Essential (primary) hypertension: Secondary | ICD-10-CM | POA: Diagnosis not present

## 2020-04-27 DIAGNOSIS — I714 Abdominal aortic aneurysm, without rupture: Secondary | ICD-10-CM | POA: Diagnosis not present

## 2020-07-09 DIAGNOSIS — G309 Alzheimer's disease, unspecified: Secondary | ICD-10-CM | POA: Diagnosis not present

## 2020-07-09 DIAGNOSIS — I4891 Unspecified atrial fibrillation: Secondary | ICD-10-CM | POA: Diagnosis not present

## 2020-07-09 DIAGNOSIS — R350 Frequency of micturition: Secondary | ICD-10-CM | POA: Diagnosis not present

## 2020-07-09 DIAGNOSIS — Z299 Encounter for prophylactic measures, unspecified: Secondary | ICD-10-CM | POA: Diagnosis not present

## 2020-10-01 DIAGNOSIS — R600 Localized edema: Secondary | ICD-10-CM | POA: Diagnosis not present

## 2020-10-01 DIAGNOSIS — I10 Essential (primary) hypertension: Secondary | ICD-10-CM | POA: Diagnosis not present

## 2020-10-01 DIAGNOSIS — I4891 Unspecified atrial fibrillation: Secondary | ICD-10-CM | POA: Diagnosis not present

## 2020-10-01 DIAGNOSIS — Z299 Encounter for prophylactic measures, unspecified: Secondary | ICD-10-CM | POA: Diagnosis not present

## 2020-11-11 IMAGING — CT CT HEAD WITHOUT CONTRAST
3 of 4 series · 15 of 47 positions shown, 18 images · non-contrast
Comparison: Brain MRI 02/15/2017.  Head CT 08/28/2017.

CLINICAL DATA: 89-year-old male shunt evaluation.

EXAM:
CT HEAD WITHOUT CONTRAST
TECHNIQUE: Contiguous axial images were obtained from the base of the skull
through the vertex without intravenous contrast.

[Series 2: head 5.00 hr40 s3 axial ibhc · axial · 0.47mm/px · z∈[-521,-351]mm · 9 of 40 slices shown, 12 images]
[im 3/40  brain]
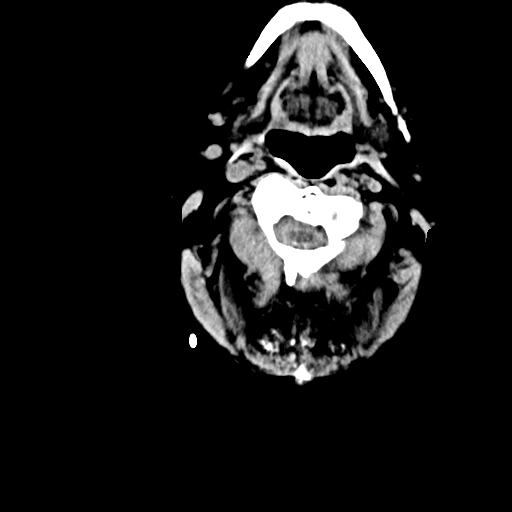
[im 3/40  bone]
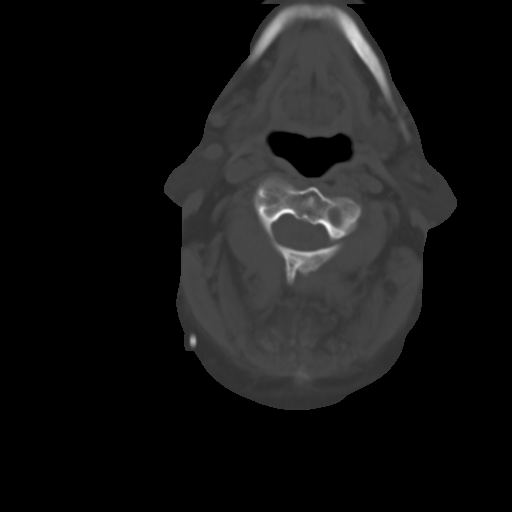
[im 9/40  brain]
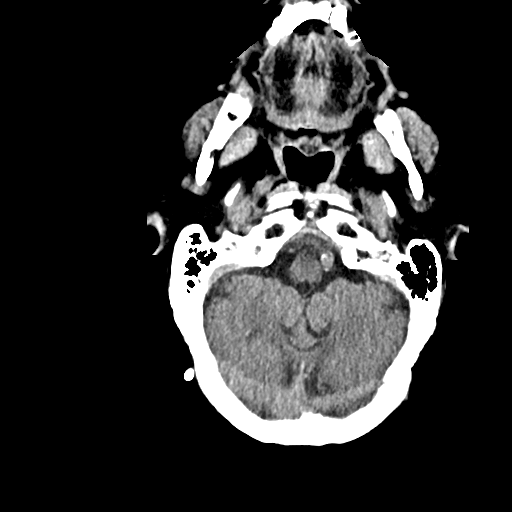
[im 12/40  brain]
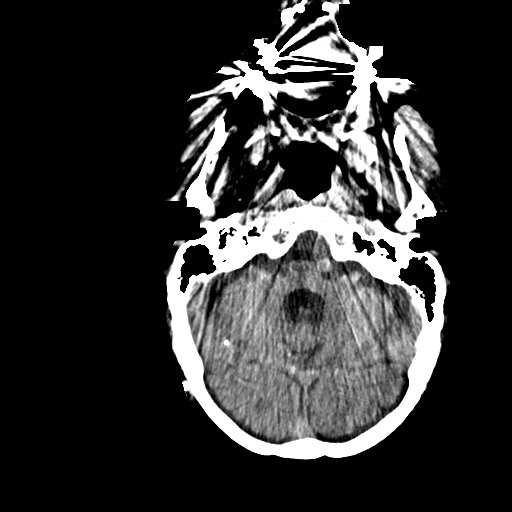
[im 17/40  brain]
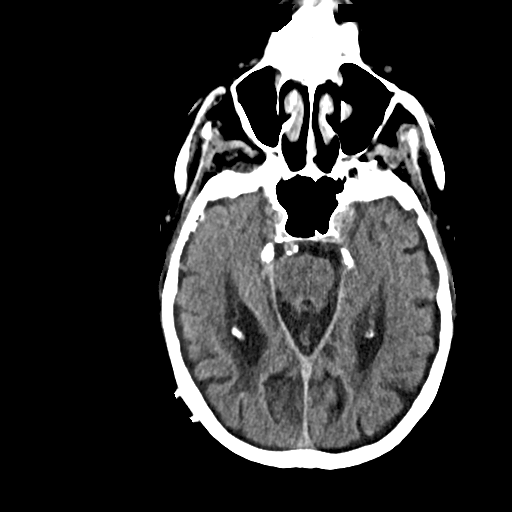
[im 20/40  brain]
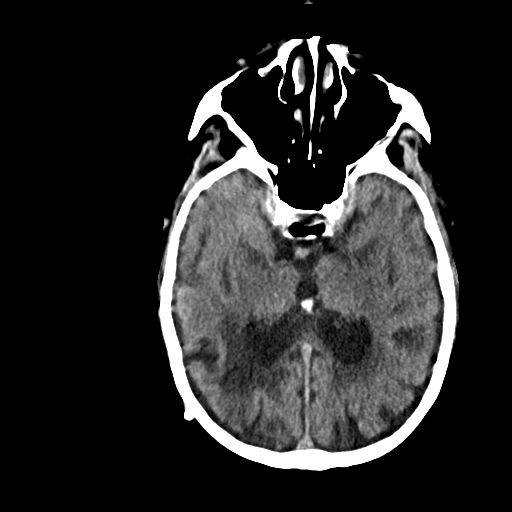
[im 20/40  bone]
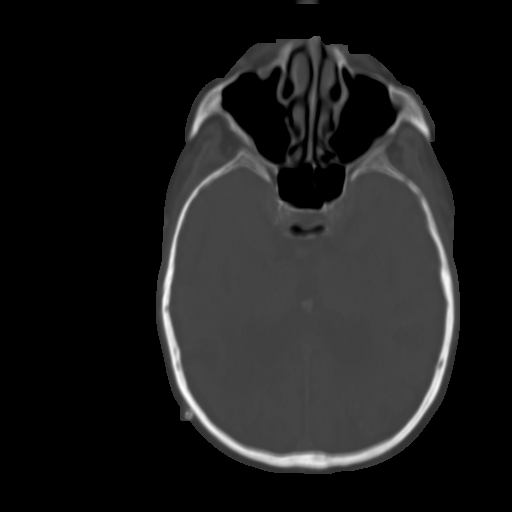
[im 23/40  brain]
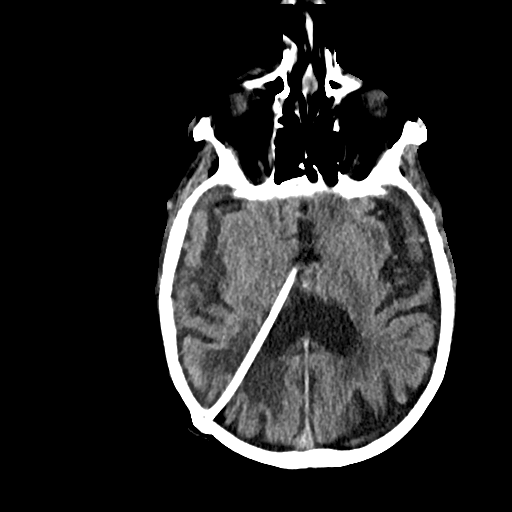
[im 28/40  brain]
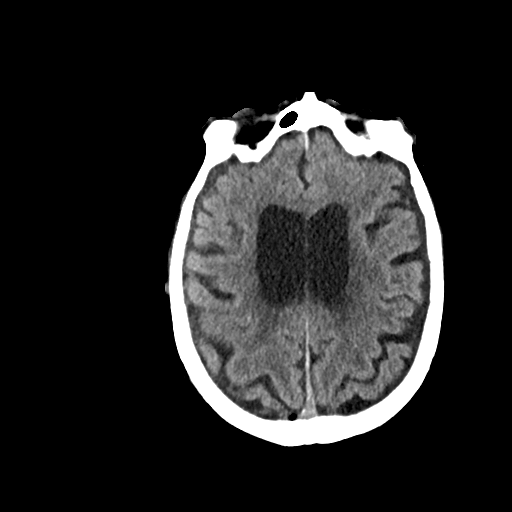
[im 31/40  brain]
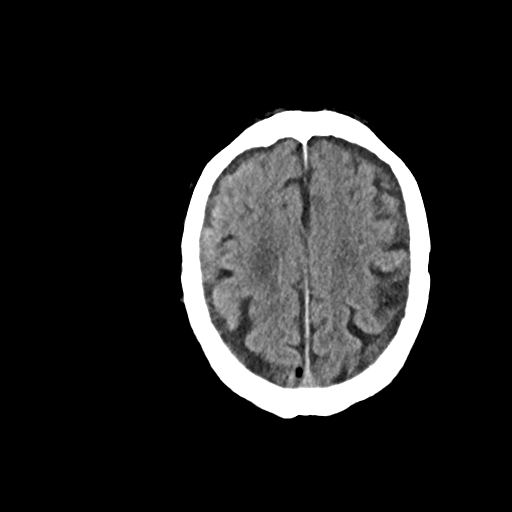
[im 37/40  brain]
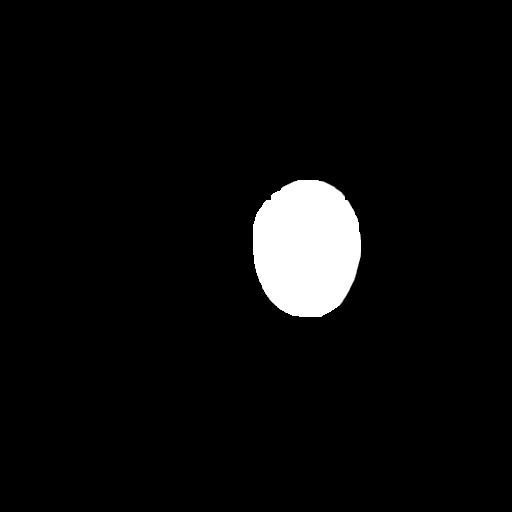
[im 37/40  bone]
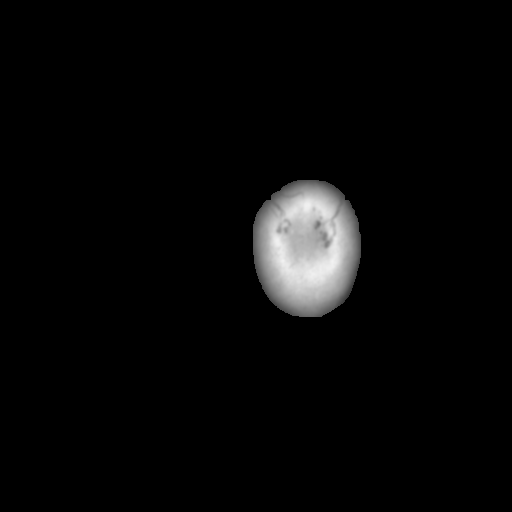

[Series 4: head 3.00 hr40 s3 sag · sagittal · 0.37mm/px · 3 of 60 slices shown]
[im 20/60  brain]
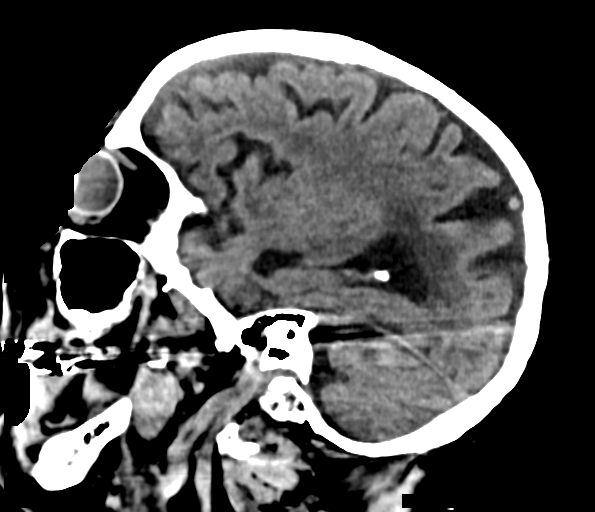
[im 30/60  brain]
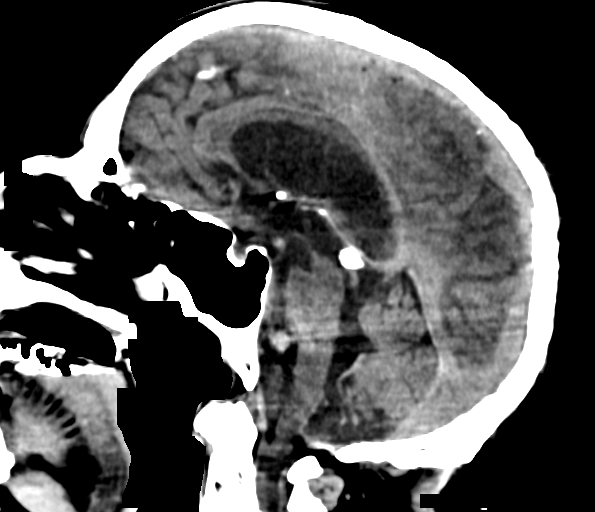
[im 40/60  brain]
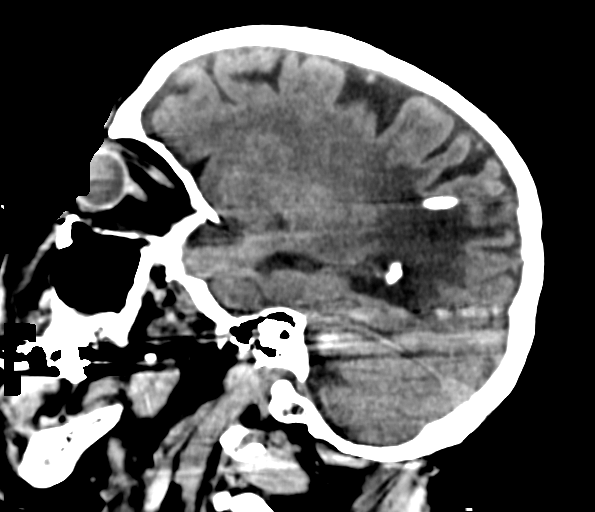

[Series 6: head 3.00 hr40 s3 cor · coronal · 0.33mm/px · 3 of 73 slices shown]
[im 25/73  brain]
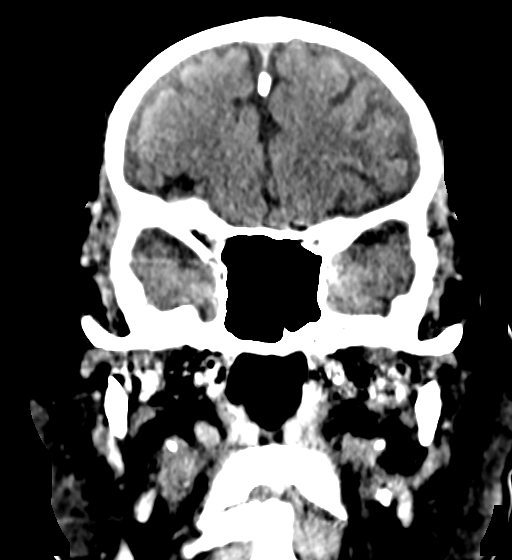
[im 33/73  brain]
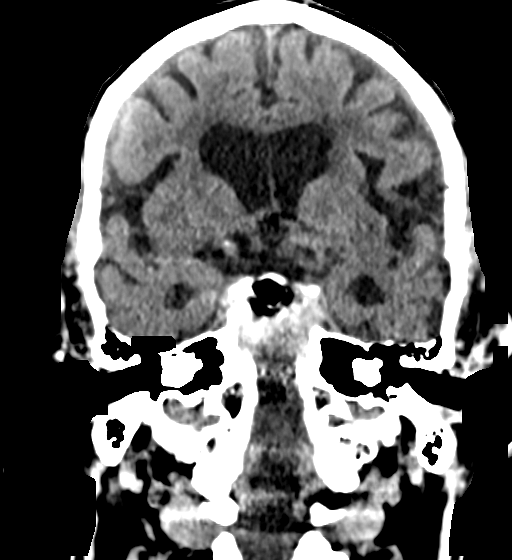
[im 41/73  brain]
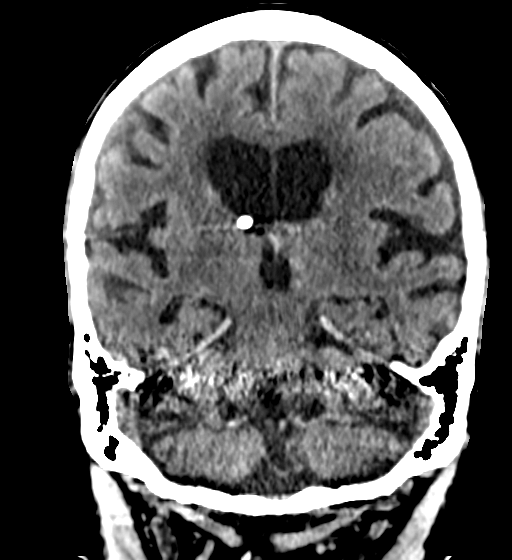

[15 of 47 positions shown; findings below may reference images not displayed]

FINDINGS: Brain: Stable right posterior approach shunt catheter since 3814,
traversing the right lateral ventricle and terminating at the
anterior 3rd of the septum pellucidum.

Comparing coronal images the ventricular system is slightly smaller
than in 3814.

Bilateral extra-axial collections well demonstrated by MRI in 3814
are stable. No associated midline shift. No significant intracranial
mass effect. Basilar cisterns are preserved.

Confluent bilateral cerebral white matter hypodensity appears stable
with a superimposed small area of right parietal lobe chronic
cortical encephalomalacia. A small chronic infarct in the posterior
right cerebellum is stable.

No acute intracranial hemorrhage or cortically based infarct
identified.

Vascular: Calcified atherosclerosis at the skull base.

Skull: No acute osseous abnormality identified.

Sinuses/Orbits: Paranasal Visualized paranasal sinuses and mastoids
are stable and well pneumatized.

Other: No acute orbit or scalp soft tissue finding.

Right posterior shunt reservoir and shunt tubing appears stable
since 3814.
IMPRESSION: Largely stable noncontrast CT appearance of the brain since 3814:

- slightly smaller ventricular system.

- stable chronic bilateral extra-axial collections likely secondary
to shunting.

- stable bilateral white matter disease, small chronic right
cerebellar infarct, and encephalomalacia in the right parietal lobe.

## 2020-12-30 DIAGNOSIS — I4891 Unspecified atrial fibrillation: Secondary | ICD-10-CM | POA: Diagnosis not present

## 2020-12-30 DIAGNOSIS — I10 Essential (primary) hypertension: Secondary | ICD-10-CM | POA: Diagnosis not present

## 2020-12-30 DIAGNOSIS — Z299 Encounter for prophylactic measures, unspecified: Secondary | ICD-10-CM | POA: Diagnosis not present

## 2020-12-30 DIAGNOSIS — I714 Abdominal aortic aneurysm, without rupture: Secondary | ICD-10-CM | POA: Diagnosis not present

## 2021-05-14 DIAGNOSIS — I10 Essential (primary) hypertension: Secondary | ICD-10-CM | POA: Diagnosis not present

## 2021-05-14 DIAGNOSIS — N390 Urinary tract infection, site not specified: Secondary | ICD-10-CM | POA: Diagnosis not present

## 2021-05-14 DIAGNOSIS — Z7189 Other specified counseling: Secondary | ICD-10-CM | POA: Diagnosis not present

## 2021-05-14 DIAGNOSIS — R5383 Other fatigue: Secondary | ICD-10-CM | POA: Diagnosis not present

## 2021-05-14 DIAGNOSIS — Z0000 Encounter for general adult medical examination without abnormal findings: Secondary | ICD-10-CM | POA: Diagnosis not present

## 2021-05-14 DIAGNOSIS — Z299 Encounter for prophylactic measures, unspecified: Secondary | ICD-10-CM | POA: Diagnosis not present

## 2021-11-03 DIAGNOSIS — G309 Alzheimer's disease, unspecified: Secondary | ICD-10-CM | POA: Diagnosis not present

## 2021-11-03 DIAGNOSIS — I10 Essential (primary) hypertension: Secondary | ICD-10-CM | POA: Diagnosis not present

## 2021-11-03 DIAGNOSIS — I4891 Unspecified atrial fibrillation: Secondary | ICD-10-CM | POA: Diagnosis not present

## 2021-11-03 DIAGNOSIS — Z299 Encounter for prophylactic measures, unspecified: Secondary | ICD-10-CM | POA: Diagnosis not present

## 2022-04-27 DIAGNOSIS — R5383 Other fatigue: Secondary | ICD-10-CM | POA: Diagnosis not present

## 2022-04-27 DIAGNOSIS — Z7189 Other specified counseling: Secondary | ICD-10-CM | POA: Diagnosis not present

## 2022-04-27 DIAGNOSIS — I4891 Unspecified atrial fibrillation: Secondary | ICD-10-CM | POA: Diagnosis not present

## 2022-04-27 DIAGNOSIS — R64 Cachexia: Secondary | ICD-10-CM | POA: Diagnosis not present

## 2022-04-27 DIAGNOSIS — Z79899 Other long term (current) drug therapy: Secondary | ICD-10-CM | POA: Diagnosis not present

## 2022-04-27 DIAGNOSIS — Z0000 Encounter for general adult medical examination without abnormal findings: Secondary | ICD-10-CM | POA: Diagnosis not present

## 2022-04-27 DIAGNOSIS — E7800 Pure hypercholesterolemia, unspecified: Secondary | ICD-10-CM | POA: Diagnosis not present

## 2022-04-27 DIAGNOSIS — Z299 Encounter for prophylactic measures, unspecified: Secondary | ICD-10-CM | POA: Diagnosis not present

## 2022-06-04 ENCOUNTER — Emergency Department (HOSPITAL_COMMUNITY): Payer: Medicare Other

## 2022-06-04 ENCOUNTER — Encounter (HOSPITAL_COMMUNITY): Payer: Self-pay

## 2022-06-04 ENCOUNTER — Other Ambulatory Visit: Payer: Self-pay

## 2022-06-04 ENCOUNTER — Inpatient Hospital Stay (HOSPITAL_COMMUNITY)
Admission: EM | Admit: 2022-06-04 | Discharge: 2022-06-15 | DRG: 178 | Disposition: A | Discharge status: Skilled Nursing Facility | Payer: Medicare Other | Attending: Internal Medicine | Admitting: Internal Medicine

## 2022-06-04 DIAGNOSIS — G912 (Idiopathic) normal pressure hydrocephalus: Secondary | ICD-10-CM | POA: Diagnosis present

## 2022-06-04 DIAGNOSIS — Z7901 Long term (current) use of anticoagulants: Secondary | ICD-10-CM

## 2022-06-04 DIAGNOSIS — F0394 Unspecified dementia, unspecified severity, with anxiety: Secondary | ICD-10-CM | POA: Diagnosis not present

## 2022-06-04 DIAGNOSIS — I4891 Unspecified atrial fibrillation: Secondary | ICD-10-CM | POA: Diagnosis not present

## 2022-06-04 DIAGNOSIS — Z87891 Personal history of nicotine dependence: Secondary | ICD-10-CM

## 2022-06-04 DIAGNOSIS — R000 Tachycardia, unspecified: Secondary | ICD-10-CM | POA: Diagnosis not present

## 2022-06-04 DIAGNOSIS — Z79899 Other long term (current) drug therapy: Secondary | ICD-10-CM

## 2022-06-04 DIAGNOSIS — R319 Hematuria, unspecified: Secondary | ICD-10-CM | POA: Diagnosis not present

## 2022-06-04 DIAGNOSIS — F03911 Unspecified dementia, unspecified severity, with agitation: Secondary | ICD-10-CM | POA: Diagnosis present

## 2022-06-04 DIAGNOSIS — K219 Gastro-esophageal reflux disease without esophagitis: Secondary | ICD-10-CM | POA: Diagnosis present

## 2022-06-04 DIAGNOSIS — R338 Other retention of urine: Secondary | ICD-10-CM | POA: Diagnosis present

## 2022-06-04 DIAGNOSIS — Z7189 Other specified counseling: Secondary | ICD-10-CM

## 2022-06-04 DIAGNOSIS — E876 Hypokalemia: Secondary | ICD-10-CM | POA: Diagnosis present

## 2022-06-04 DIAGNOSIS — Z85820 Personal history of malignant melanoma of skin: Secondary | ICD-10-CM | POA: Diagnosis not present

## 2022-06-04 DIAGNOSIS — M6281 Muscle weakness (generalized): Secondary | ICD-10-CM | POA: Diagnosis not present

## 2022-06-04 DIAGNOSIS — Z888 Allergy status to other drugs, medicaments and biological substances status: Secondary | ICD-10-CM

## 2022-06-04 DIAGNOSIS — R5383 Other fatigue: Secondary | ICD-10-CM | POA: Diagnosis not present

## 2022-06-04 DIAGNOSIS — Z8673 Personal history of transient ischemic attack (TIA), and cerebral infarction without residual deficits: Secondary | ICD-10-CM

## 2022-06-04 DIAGNOSIS — R262 Difficulty in walking, not elsewhere classified: Secondary | ICD-10-CM

## 2022-06-04 DIAGNOSIS — K5900 Constipation, unspecified: Secondary | ICD-10-CM | POA: Diagnosis not present

## 2022-06-04 DIAGNOSIS — Z66 Do not resuscitate: Secondary | ICD-10-CM | POA: Diagnosis present

## 2022-06-04 DIAGNOSIS — N179 Acute kidney failure, unspecified: Secondary | ICD-10-CM | POA: Diagnosis not present

## 2022-06-04 DIAGNOSIS — S0990XA Unspecified injury of head, initial encounter: Secondary | ICD-10-CM | POA: Diagnosis not present

## 2022-06-04 DIAGNOSIS — F05 Delirium due to known physiological condition: Secondary | ICD-10-CM | POA: Diagnosis present

## 2022-06-04 DIAGNOSIS — F0390 Unspecified dementia without behavioral disturbance: Secondary | ICD-10-CM | POA: Diagnosis present

## 2022-06-04 DIAGNOSIS — E785 Hyperlipidemia, unspecified: Secondary | ICD-10-CM | POA: Diagnosis present

## 2022-06-04 DIAGNOSIS — R509 Fever, unspecified: Secondary | ICD-10-CM | POA: Diagnosis not present

## 2022-06-04 DIAGNOSIS — R531 Weakness: Secondary | ICD-10-CM

## 2022-06-04 DIAGNOSIS — Z515 Encounter for palliative care: Secondary | ICD-10-CM

## 2022-06-04 DIAGNOSIS — Z982 Presence of cerebrospinal fluid drainage device: Secondary | ICD-10-CM

## 2022-06-04 DIAGNOSIS — G40909 Epilepsy, unspecified, not intractable, without status epilepticus: Secondary | ICD-10-CM | POA: Diagnosis present

## 2022-06-04 DIAGNOSIS — R296 Repeated falls: Secondary | ICD-10-CM | POA: Diagnosis not present

## 2022-06-04 DIAGNOSIS — Z85038 Personal history of other malignant neoplasm of large intestine: Secondary | ICD-10-CM | POA: Diagnosis not present

## 2022-06-04 DIAGNOSIS — U071 COVID-19: Principal | ICD-10-CM | POA: Diagnosis present

## 2022-06-04 DIAGNOSIS — I10 Essential (primary) hypertension: Secondary | ICD-10-CM | POA: Diagnosis present

## 2022-06-04 DIAGNOSIS — R2689 Other abnormalities of gait and mobility: Secondary | ICD-10-CM | POA: Diagnosis not present

## 2022-06-04 DIAGNOSIS — N401 Enlarged prostate with lower urinary tract symptoms: Secondary | ICD-10-CM | POA: Diagnosis present

## 2022-06-04 LAB — BASIC METABOLIC PANEL
Anion gap: 9 (ref 5–15)
BUN: 14 mg/dL (ref 8–23)
CO2: 24 mmol/L (ref 22–32)
Calcium: 9.1 mg/dL (ref 8.9–10.3)
Chloride: 102 mmol/L (ref 98–111)
Creatinine, Ser: 1.28 mg/dL — ABNORMAL HIGH (ref 0.61–1.24)
GFR, Estimated: 53 mL/min — ABNORMAL LOW (ref >60)
Glucose, Bld: 108 mg/dL — ABNORMAL HIGH (ref 70–99)
Potassium: 3.8 mmol/L (ref 3.5–5.1)
Sodium: 135 mmol/L (ref 135–145)

## 2022-06-04 LAB — CBC WITH DIFFERENTIAL/PLATELET
Abs Immature Granulocytes: 0.02 10*3/uL (ref 0.00–0.07)
Basophils Absolute: 0 10*3/uL (ref 0.0–0.1)
Basophils Relative: 0 %
Eosinophils Absolute: 0 10*3/uL (ref 0.0–0.5)
Eosinophils Relative: 0 %
HCT: 45.8 % (ref 39.0–52.0)
Hemoglobin: 15.1 g/dL (ref 13.0–17.0)
Immature Granulocytes: 0 %
Lymphocytes Relative: 7 %
Lymphs Abs: 0.5 10*3/uL — ABNORMAL LOW (ref 0.7–4.0)
MCH: 32.7 pg (ref 26.0–34.0)
MCHC: 33 g/dL (ref 30.0–36.0)
MCV: 99.1 fL (ref 80.0–100.0)
Monocytes Absolute: 0.8 10*3/uL (ref 0.1–1.0)
Monocytes Relative: 11 %
Neutro Abs: 5.9 10*3/uL (ref 1.7–7.7)
Neutrophils Relative %: 82 %
Platelets: 160 10*3/uL (ref 150–400)
RBC: 4.62 MIL/uL (ref 4.22–5.81)
RDW: 12.9 % (ref 11.5–15.5)
WBC: 7.3 10*3/uL (ref 4.0–10.5)
nRBC: 0 % (ref 0.0–0.2)

## 2022-06-04 LAB — URINALYSIS, ROUTINE W REFLEX MICROSCOPIC
Bacteria, UA: NONE SEEN
Bilirubin Urine: NEGATIVE
Glucose, UA: NEGATIVE mg/dL
Ketones, ur: 5 mg/dL — AB
Leukocytes,Ua: NEGATIVE
Nitrite: NEGATIVE
Protein, ur: 30 mg/dL — AB
RBC / HPF: >50 RBC/hpf — ABNORMAL HIGH (ref 0–5)
Specific Gravity, Urine: 1.017 (ref 1.005–1.030)
pH: 6 (ref 5.0–8.0)

## 2022-06-04 LAB — SARS CORONAVIRUS 2 BY RT PCR: SARS Coronavirus 2 by RT PCR: POSITIVE — AB

## 2022-06-04 LAB — LACTIC ACID, PLASMA
Lactic Acid, Venous: 2.4 mmol/L (ref 0.5–1.9)
Lactic Acid, Venous: 2 mmol/L (ref 0.5–1.9)

## 2022-06-04 LAB — CBG MONITORING, ED: Glucose-Capillary: 127 mg/dL — ABNORMAL HIGH (ref 70–99)

## 2022-06-04 MED ORDER — KETOROLAC TROMETHAMINE 15 MG/ML IJ SOLN: 15.0000 mg | Freq: Four times a day (QID) | INTRAMUSCULAR | Status: DC | PRN

## 2022-06-04 MED ORDER — ALBUTEROL SULFATE (2.5 MG/3ML) 0.083% IN NEBU: 3.0000 mL | INHALATION_SOLUTION | Freq: Four times a day (QID) | RESPIRATORY_TRACT | Status: DC

## 2022-06-04 MED ORDER — SODIUM CHLORIDE 0.9 % IV SOLN
1.0000 g | Freq: Once | INTRAVENOUS | Status: AC
  Administered 2022-06-04: 1 g via INTRAVENOUS
  Filled 2022-06-04: qty 10

## 2022-06-04 MED ORDER — LORAZEPAM 2 MG/ML IJ SOLN
0.2500 mg | Freq: Once | INTRAMUSCULAR | Status: AC
  Administered 2022-06-04: 0.25 mg via INTRAVENOUS
  Filled 2022-06-04: qty 1

## 2022-06-04 MED ORDER — TRAZODONE HCL 50 MG PO TABS: 50.0000 mg | ORAL_TABLET | Freq: Every evening | ORAL | Status: DC | PRN

## 2022-06-04 MED ORDER — OXYCODONE HCL 5 MG PO TABS: 5.0000 mg | ORAL_TABLET | ORAL | Status: DC | PRN

## 2022-06-04 MED ORDER — PANTOPRAZOLE SODIUM 40 MG PO TBEC
40.0000 mg | DELAYED_RELEASE_TABLET | Freq: Every day | ORAL | Status: DC
  Administered 2022-06-05 – 2022-06-15 (×10): 40 mg via ORAL
  Filled 2022-06-04 (×11): qty 1

## 2022-06-04 MED ORDER — FINASTERIDE 5 MG PO TABS
5.0000 mg | ORAL_TABLET | Freq: Every evening | ORAL | Status: DC
  Administered 2022-06-05 – 2022-06-14 (×10): 5 mg via ORAL
  Filled 2022-06-04 (×11): qty 1

## 2022-06-04 MED ORDER — VITAMIN D 25 MCG (1000 UNIT) PO TABS
2000.0000 [IU] | ORAL_TABLET | Freq: Every day | ORAL | Status: DC
  Administered 2022-06-05 – 2022-06-15 (×9): 2000 [IU] via ORAL
  Filled 2022-06-04 (×11): qty 2

## 2022-06-04 MED ORDER — ACETAMINOPHEN 650 MG RE SUPP: 650.0000 mg | Freq: Four times a day (QID) | RECTAL | Status: DC | PRN

## 2022-06-04 MED ORDER — LEVETIRACETAM 500 MG PO TABS
1500.0000 mg | ORAL_TABLET | Freq: Two times a day (BID) | ORAL | Status: DC
  Administered 2022-06-04 – 2022-06-15 (×21): 1500 mg via ORAL
  Filled 2022-06-04 (×23): qty 3

## 2022-06-04 MED ORDER — ROSUVASTATIN CALCIUM 10 MG PO TABS: 5.0000 mg | ORAL_TABLET | ORAL | Status: DC

## 2022-06-04 MED ORDER — ALBUTEROL SULFATE (2.5 MG/3ML) 0.083% IN NEBU: 3.0000 mL | INHALATION_SOLUTION | Freq: Four times a day (QID) | RESPIRATORY_TRACT | Status: DC | PRN

## 2022-06-04 MED ORDER — CITALOPRAM HYDROBROMIDE 20 MG PO TABS
20.0000 mg | ORAL_TABLET | Freq: Two times a day (BID) | ORAL | Status: DC
  Administered 2022-06-04 – 2022-06-05 (×3): 20 mg via ORAL
  Filled 2022-06-04 (×4): qty 1

## 2022-06-04 MED ORDER — POLYETHYLENE GLYCOL 3350 17 G PO PACK
17.0000 g | PACK | Freq: Every day | ORAL | Status: DC | PRN
  Administered 2022-06-13: 17 g via ORAL
  Filled 2022-06-04: qty 1

## 2022-06-04 MED ORDER — VITAMIN B-12 1000 MCG PO TABS
1000.0000 ug | ORAL_TABLET | Freq: Every day | ORAL | Status: DC
  Administered 2022-06-05 – 2022-06-15 (×9): 1000 ug via ORAL
  Filled 2022-06-04 (×11): qty 1

## 2022-06-04 MED ORDER — LACTATED RINGERS IV BOLUS
700.0000 mL | Freq: Once | INTRAVENOUS | Status: AC
  Administered 2022-06-04: 700 mL via INTRAVENOUS

## 2022-06-04 MED ORDER — MELATONIN 3 MG PO TABS
9.0000 mg | ORAL_TABLET | Freq: Every day | ORAL | Status: DC
  Administered 2022-06-04 – 2022-06-13 (×8): 9 mg via ORAL
  Filled 2022-06-04 (×10): qty 3

## 2022-06-04 MED ORDER — SODIUM CHLORIDE 0.9 % IV BOLUS
2000.0000 mL | Freq: Once | INTRAVENOUS | Status: AC
  Administered 2022-06-04: 2000 mL via INTRAVENOUS

## 2022-06-04 MED ORDER — GUAIFENESIN-DM 100-10 MG/5ML PO SYRP: 10.0000 mL | ORAL_SOLUTION | ORAL | Status: DC | PRN

## 2022-06-04 MED ORDER — NIRMATRELVIR/RITONAVIR (PAXLOVID) TABLET (RENAL DOSING)
2.0000 | ORAL_TABLET | Freq: Two times a day (BID) | ORAL | Status: DC
  Administered 2022-06-04: 2 via ORAL
  Filled 2022-06-04: qty 20

## 2022-06-04 MED ORDER — APIXABAN 2.5 MG PO TABS
2.5000 mg | ORAL_TABLET | Freq: Two times a day (BID) | ORAL | Status: DC
  Administered 2022-06-04: 2.5 mg via ORAL
  Filled 2022-06-04 (×2): qty 1

## 2022-06-04 MED ORDER — APIXABAN 5 MG PO TABS: 5.0000 mg | ORAL_TABLET | Freq: Two times a day (BID) | ORAL | Status: DC

## 2022-06-04 MED ORDER — ONDANSETRON HCL 4 MG PO TABS: 4.0000 mg | ORAL_TABLET | Freq: Four times a day (QID) | ORAL | Status: DC | PRN

## 2022-06-04 MED ORDER — TRAZODONE HCL 50 MG PO TABS: 25.0000 mg | ORAL_TABLET | Freq: Every evening | ORAL | Status: DC | PRN

## 2022-06-04 MED ORDER — LORAZEPAM 2 MG/ML IJ SOLN
0.2500 mg | Freq: Once | INTRAMUSCULAR | Status: AC
Start: 2022-06-04 — End: 2022-06-04
  Administered 2022-06-04: 0.25 mg via INTRAVENOUS
  Filled 2022-06-04: qty 1

## 2022-06-04 MED ORDER — ONDANSETRON HCL 4 MG/2ML IJ SOLN: 4.0000 mg | Freq: Four times a day (QID) | INTRAMUSCULAR | Status: DC | PRN

## 2022-06-04 MED ORDER — POTASSIUM CHLORIDE IN NACL 20-0.9 MEQ/L-% IV SOLN
INTRAVENOUS | Status: DC
  Filled 2022-06-04: qty 1000

## 2022-06-04 MED ORDER — ACETAMINOPHEN 325 MG PO TABS
650.0000 mg | ORAL_TABLET | Freq: Four times a day (QID) | ORAL | Status: DC | PRN
  Administered 2022-06-05 – 2022-06-06 (×2): 650 mg via ORAL
  Filled 2022-06-04 (×2): qty 2

## 2022-06-04 MED ORDER — LORAZEPAM 2 MG/ML IJ SOLN
0.2500 mg | INTRAMUSCULAR | Status: DC | PRN
  Administered 2022-06-06 – 2022-06-08 (×2): 0.25 mg via INTRAVENOUS
  Filled 2022-06-04 (×4): qty 1

## 2022-06-04 MED ORDER — HYDROCOD POLI-CHLORPHE POLI ER 10-8 MG/5ML PO SUER: 5.0000 mL | Freq: Two times a day (BID) | ORAL | Status: DC | PRN

## 2022-06-04 MED ORDER — LORAZEPAM 2 MG/ML IJ SOLN: 0.5000 mg | Freq: Once | INTRAMUSCULAR | Status: DC

## 2022-06-04 NOTE — ED Notes (Signed)
Patient's daughter at bedside, patient is getting restless and agitated, , unable to repositioned, patient is trying to get out of the bed, pulling his IV line and removing all the covers. Repositioning and redirection unsuccessful. PA notified. New orders given Ativan .25 mg IV

## 2022-06-04 NOTE — ED Notes (Signed)
Patient in bed with daughter at bedside, uncooperative with care. Patient confused to person, time and place. Patient yelling and agitated with staff resisting care. Redirection unsuccessful.

## 2022-06-04 NOTE — H&P (Signed)
History and Physical    Carlos Ballard JKD:[id number] DOB: 1928-07-04 DOA: 06/04/2022  PCP: Glenda Chroman, MD  Patient coming from: Home  I have personally briefly reviewed patient's old medical records in [place]  Chief Complaint: Weakness over the past couple of days  HPI: Carlos Ballard is a 88 y.o. male with medical history significant of atrial fibrillation on Eliquis, stroke, normal pressure hydrocephalus with shunt in place, dementia with occasional agitation, gastroesophageal reflux disease, and hypertension as well as a seizure disorder who presents for evaluation of multiple falls and increased weakness which started over the past 2 days.  Patient lives with his daughter and she states she has not seen his falls but he was on the floor beside his bed this morning when she first saw him.  He had another fall where he simply slid off the end of his bed that he was sitting on today.  He also fell in the bathroom striking the left side of his head but she did not witness that full and heard him and went to him immediately.  There was no loss of consciousness and he does not complain of pain.  He had no noticeable injury with these falls.  He has been eating well today with no fevers, no vomiting, no diarrhea, no complaints of chest pain or abdominal pain, no shortness of breath, no headaches.  He has been shuffling his left foot a little bit more than normally with ambulation and uses a Datta at baseline.  She has noticed this change in his gait over the past several days.  Review of his chart shows that he has small left pontine stroke in 2010 with staring episodes every 6 weeks for which he is treated for as seizures.  History is mostly provided by the patient's daughter.  ED Course: In the ED patient was found to be febrile to 100.9.  Positive for COVID-19, lactic acid elevated at 2.4 with a recheck at 2.0.  Chest x-ray negative for pulmonary edema or focal pulmonary  consolidation.  CT scan of head showed no acute intracranial pathology chronic volume loss consistent with age and dementia.  Intraventricular shunt was noted with no hydrocephalus.  She received 2 L of IV fluids, Ativan 0.25 mg x 2 doses, ceftriaxone 1 g, 100 mL of lactated Ringer's.  As patient unable to ambulate well and daughter unable to care for him on her own given his recurrent falls he was referred to me for further evaluation and management.  Review of Systems: As per HPI otherwise all other systems reviewed and  negative.  History limited by patient's dementia  Past Medical History:  Diagnosis Date   AAA (abdominal aortic aneurysm) ([place])    Incisional Hernia   Abdominal pain    Allergic rhinitis, cause unspecified    Atrial fibrillation (HCC)    Bell's palsy    BPH (benign prostatic hyperplasia)    Cancer (HCC)    colon   Colon polyp    Malignant   Dementia ([place])    Frequency of urination and polyuria    Hyperlipidemia    Hypertension    Kidney stones    Lumbago    Malaise and fatigue    Melanoma ([place])    Obstructive hydrocephalus ([place])    Seizure ([place]) 12/01   Stroke ([place]) 02/14/2017    Past Surgical History:  Procedure Laterality Date   CHOLECYSTECTOMY     COLON SURGERY     COLONOSCOPY  MELANOMA EXCISION  07/2008   NASAL SINUS SURGERY     POLYPECTOMY  11/2005    Social History   Social History Narrative   Pt lives at home with his daughter, [name].   Caffeine Use: 1/2 caff coffee- 1/2- 1 pot daily in winter     reports that he quit smoking about 12 years ago. His smoking use included cigars. He has never used smokeless tobacco. He reports that he does not drink alcohol and does not use drugs.  Allergies  Allergen Reactions   Depakote [Divalproex Sodium] Swelling and Other (See Comments)     low blood counts   Phenytek [Phenytoin Sodium Extended] Other (See Comments)    ineffective    Family History  Problem Relation Age of Onset   Cancer Mother         ovarian cancer   Hypertension Father    Family history reviewed and found to be noncontributory  Prior to Admission medications   Medication Sig Start Date End Date Taking? Authorizing Provider  Cholecalciferol 50 MCG (2000 UT) CAPS Take 1 capsule by mouth daily.   Yes [provider]  citalopram (CELEXA) 20 MG tablet Take 20 mg by mouth 2 (two) times daily. 05/14/22  Yes [provider]  cyanocobalamin (VITAMIN B12) 1000 MCG tablet Take 1,000 mcg by mouth daily.   Yes [provider]  diltiazem (CARDIZEM CD) 240 MG 24 hr capsule Take 240 mg by mouth at bedtime. 03/21/13  Yes [provider]  ELIQUIS 5 MG TABS tablet Take 5 mg by mouth 2 (two) times daily. 05/02/22  Yes [provider]  esomeprazole (NEXIUM) 40 MG capsule Take 20 mg by mouth daily as needed (acid reflux).   Yes [provider]  finasteride (PROSCAR) 5 MG tablet Take 5 mg by mouth every evening.    Yes [provider]  KEPPRA 750 MG tablet Take 2 tablets (1,500 mg total) by mouth 2 (two) times daily. Brand medically necessary 03/15/18  Yes Penumalli, Earlean Polka, MD  loratadine (CLARITIN) 10 MG tablet Take 10 mg by mouth daily as needed for allergies.   Yes [provider]  losartan (COZAAR) 100 MG tablet Take 100 mg by mouth daily. 05/04/22  Yes [provider]  melatonin 5 MG TABS Take 2.5-10 mg by mouth at bedtime. Takes 2.5 mg in the afternoon and 10 mg at bedtime.   Yes [provider]  rosuvastatin (CRESTOR) 5 MG tablet Take 5 mg by mouth once a week. Takes on Fridays. 05/30/22  Yes [provider]    Physical Exam:  Constitutional: NAD, calm, comfortable Vitals:   06/04/22 1316 06/04/22 1420 06/04/22 1632 06/04/22 1700  BP:  129/89 (!) 162/87   Pulse:  84 91 72  Resp:  17 16   Temp:  (!) 100.9 F (38.3 C) 100.1 F (37.8 C)   TempSrc:  Axillary Axillary   SpO2:  97% 100% (!) 89%  Weight: 90.7 kg     Height: '5\' 8"'$   (1.727 m)      Eyes: PERRL, lids and conjunctivae normal ENMT: Mucous membranes are moist. Posterior pharynx clear of any exudate or lesions.Normal dentition.  Neck: normal, supple, no masses, no thyromegaly Respiratory: clear to auscultation bilaterally, no wheezing, no crackles. Normal respiratory effort. No accessory muscle use.  Cardiovascular: Regular rate and rhythm, no murmurs / rubs / gallops. No extremity edema. 2+ pedal pulses. No carotid bruits.  Abdomen: no tenderness, no masses palpated. No hepatosplenomegaly. Bowel  sounds positive.  Musculoskeletal: no clubbing / cyanosis. No joint deformity upper and lower extremities. Good ROM, no contractures. Normal muscle tone.  Skin: no rashes, lesions, ulcers. No induration; feels hot to touch Neurologic: CN 2-12 grossly intact. Sensation intact, DTR normal. Strength 5/5 in all 4.  Psychiatric: Normal judgment and insight. Alert and oriented x 3. Normal mood.    Labs on Admission: I have personally reviewed following labs and imaging studies  CBC: Recent Labs  Lab 06/04/22 1343  WBC 7.3  NEUTROABS 5.9  HGB 15.1  HCT 45.8  MCV 99.1  PLT 809   Basic Metabolic Panel: Recent Labs  Lab 06/04/22 1332  NA 135  K 3.8  CL 102  CO2 24  GLUCOSE 108*  BUN 14  CREATININE 1.28*  CALCIUM 9.1   GFR: Estimated Creatinine Clearance: 40.3 mL/min (A) (by C-G formula based on SCr of 1.28 mg/dL (H)).  CBG: Recent Labs  Lab 06/04/22 1325  GLUCAP 127*   Urine analysis:    Component Value Date/Time   COLORURINE YELLOW 06/04/2022 1700   APPEARANCEUR HAZY (A) 06/04/2022 1700   LABSPEC 1.017 06/04/2022 1700   PHURINE 6.0 06/04/2022 1700   GLUCOSEU NEGATIVE 06/04/2022 1700   HGBUR LARGE (A) 06/04/2022 1700   BILIRUBINUR NEGATIVE 06/04/2022 1700   KETONESUR 5 (A) 06/04/2022 1700   PROTEINUR 30 (A) 06/04/2022 1700   NITRITE NEGATIVE 06/04/2022 1700   LEUKOCYTESUR NEGATIVE 06/04/2022 1700    Radiological Exams on Admission: DG  Chest Portable 1 View  Result Date: 06/04/2022 CLINICAL DATA:  Fever, weakness EXAM: PORTABLE CHEST 1 VIEW COMPARISON:  08/18/2009 FINDINGS: Transverse diameter of heart is in upper limits of normal. Thoracic aorta is tortuous. Lung fields are clear of any infiltrates or pulmonary edema. There is no pleural effusion or pneumothorax. A catheter is noted in right side of neck and right chest, most likely ventriculoperitoneal shunt. IMPRESSION: There are no signs of pulmonary edema or focal pulmonary consolidation. Electronically Signed   By: Elmer Picker M.D.   On: 06/04/2022 16:29   CT HEAD WO CONTRAST  Result Date: 06/04/2022 CLINICAL DATA:  Head trauma, fall EXAM: CT HEAD WITHOUT CONTRAST TECHNIQUE: Contiguous axial images were obtained from the base of the skull through the vertex without intravenous contrast. RADIATION DOSE REDUCTION: This exam was performed according to the departmental dose-optimization program which includes automated exposure control, adjustment of the mA and/or kV according to patient size and/or use of iterative reconstruction technique. COMPARISON:  06/11/2019 FINDINGS: Brain: No evidence of acute infarction, hemorrhage, hydrocephalus, extra-axial collection or mass lesion/mass effect. Unchanged right parietal approach intraventricular shunt catheter. Extensive periventricular and deep white matter hypodensity with mild global cerebral volume loss. 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 Vascular: No hyperdense vessel or unexpected calcification. Skull: Normal. Negative for fracture or focal lesion. Sinuses/Orbits: No acute finding. Other: None. IMPRESSION: 1. No acute intracranial pathology. 2. Unchanged right parietal approach intraventricular shunt catheter. No hydrocephalus. 3. Extensive periventricular and deep white matter hypodensity with mild global cerebral volume loss, in keeping with advanced patient age. 4. Unchanged, probable small chronic subdural  hygromas. Electronically Signed   By: Delanna Ahmadi M.D.   On: 06/04/2022 16:23     Assessment/Plan Principal Problem:   Ambulatory dysfunction Active Problems:   COVID-19 virus infection   Atrial fibrillation (HCC)   NPH (normal pressure hydrocephalus) (HCC)   Seizure disorder (HCC)   Dementia without behavioral disturbance (HCC)   Weakness    1.  Ambulatory dysfunction: With  multiple falls today at least 3.  His daughter is unable to manage him at home with this many falls.  She would like for him to be admitted so he can get stronger and go home.  Did suggest that the patient might require a skilled nursing facility stay.  She was very leery of that as the patient require someone to be with him when he is not in his home.  They might want to consider home health rehab or outpatient rehabilitation.  We will consult physical therapy for further evaluation and management once patient is stronger and feeling better from his COVID.  2.  COVID-19 virus infection: Discussed with the daughter the pros and cons of starting Paxlovid.  We will begin Paxlovid treatment and renal adjusted dose given patient's creatinine of 1.28 with a decreased GFR.  Give the patient 1 additional liter of fluid and monitor his respiratory status closely.  He is currently not having any hypoxemia.  3.  Atrial fibrillation: We will dose adjust patient's Eliquis to 2.5 mg p.o. twice daily given that he is on Paxlovid.  We will monitor closely.  4.  Normal pressure hydrocephalus: Shunt is intact and CT scan shows it is functioning acceptably.  5.  Seizure disorder: Patient takes Keppra at home and uses brand name.  If we do not have brand-name here I have requested that daughter bring in his pill bottle and we will administer patient's medications from home.  6.  Dementia without behavioral disturbance: Does well as long as he is in his home environment however he can get occasionally confused and agitated when he is not  at home.  Daughter plans to stay with him while he is here.  Patient did have some anxiety earlier tonight and did well with Ativan 0.25 mg which I have ordered as needed.  7.  Weakness: Likely cause of his ambulatory dysfunction probably related to COVID-19 infection.  We will ask physical therapy to see him in a couple of days to see if he would benefit from home physical therapy.  Not sure if the daughter is interested in skilled nursing facility at this point.  DVT prophylaxis: Eliquis Code Status: DNR Family Communication: Spoke at length with patient's daughter who is present at the time of admission Disposition Plan: Possibly home although patient if he continues to have problems with falling may require skilled nursing facility.  Will discuss with family prior to decision making. Consults called: None Admission status: Inpatient   Lady Deutscher MD FACP [facility] Pager [phone]  How to contact the [facility] Attending or Consulting provider [place] or covering provider during after hours [place], for this patient?  Check the care team in [facility] and look for a) attending/consulting [facility] provider listed and b) the [facility] team listed Log into [email or web address] and use [place]'s universal password to access. If you do not have the password, please contact the hospital operator. Locate the [facility] provider you are looking for under [facility] and page to a number that you can be directly reached. If you still have difficulty reaching the provider, please page the [facility] (Director on Call) for the Hospitalists listed on amion for assistance.  If 7PM-7AM, please contact night-coverage [email or web address] Password [facility]  06/04/2022, 7:18 PM

## 2022-06-04 NOTE — ED Provider Notes (Signed)
[facility] EMERGENCY DEPARTMENT Provider Note   CSN: [id number] Arrival date & time: 06/04/22  1309     History  Chief Complaint  Patient presents with   Weakness    Carlos Ballard is a 88 y.o. male with history including atrial fibrillation on Eliquis, history of CVA, history of normal pressure hydrocephalus with a shunt in place, dementia, GERD and hypertension, also history of seizure disorder presenting for evaluation of multiple falls and increased generalized weakness which started today.  Patient lives in his daughter's home, she states that she has not witnessed his falls, but states he was on the floor beside his bed this morning when she first saw him this morning, he had an additional fall where he simply slid off the end of his bed as he was sitting on the end of the bed, he also had a fall when he was in his bathroom striking the left side of his head.  She did not witness the fall but heard it and went to him immediately.  There was no LOC, he has no complaints of pain or injury with these falls.  He has had a good appetite until today, no fevers, no vomiting, diarrhea no complaints of chest pain or abdominal pain.  She notes that he has been shuffling his left foot more than normal with ambulation, uses a [place] at baseline.  She has noticed this change in his gait over the past several days.  Review of chart indicates that patient had a small left pontine stroke in 20 10, residual side effects including staring episodes every 6 weeks, no history of seizures.  The history is provided by the patient and a relative (daughter at bedside).       Home Medications Prior to Admission medications   Medication Sig Start Date End Date Taking? Authorizing Provider  Cholecalciferol 50 MCG (2000 UT) CAPS Take 1 capsule by mouth daily.   Yes [provider]  citalopram (CELEXA) 20 MG tablet Take 20 mg by mouth 2 (two) times daily. 05/14/22  Yes [provider]   cyanocobalamin (VITAMIN B12) 1000 MCG tablet Take 1,000 mcg by mouth daily.   Yes [provider]  diltiazem (CARDIZEM CD) 240 MG 24 hr capsule Take 240 mg by mouth at bedtime. 03/21/13  Yes [provider]  ELIQUIS 5 MG TABS tablet Take 5 mg by mouth 2 (two) times daily. 05/02/22  Yes [provider]  esomeprazole (NEXIUM) 40 MG capsule Take 20 mg by mouth daily as needed (acid reflux).   Yes [provider]  finasteride (PROSCAR) 5 MG tablet Take 5 mg by mouth every evening.    Yes [provider]  KEPPRA 750 MG tablet Take 2 tablets (1,500 mg total) by mouth 2 (two) times daily. Brand medically necessary 03/15/18  Yes Penumalli, Earlean Polka, MD  loratadine (CLARITIN) 10 MG tablet Take 10 mg by mouth daily as needed for allergies.   Yes [provider]  losartan (COZAAR) 100 MG tablet Take 100 mg by mouth daily. 05/04/22  Yes [provider]  melatonin 5 MG TABS Take 2.5-10 mg by mouth at bedtime. Takes 2.5 mg in the afternoon and 10 mg at bedtime.   Yes [provider]  rosuvastatin (CRESTOR) 5 MG tablet Take 5 mg by mouth once a week. Takes on Fridays. 05/30/22  Yes [provider]      Allergies    Depakote [divalproex sodium] and Phenytek [phenytoin sodium extended]  Review of Systems   Review of Systems  Constitutional:  Negative for fever.  HENT:  Negative for congestion and sore throat.   Eyes: Negative.   Respiratory:  Negative for chest tightness and shortness of breath.   Cardiovascular:  Negative for chest pain.  Gastrointestinal:  Negative for abdominal pain and nausea.  Genitourinary: Negative.   Musculoskeletal:  Positive for gait problem. Negative for arthralgias, joint swelling and neck pain.  Skin: Negative.  Negative for rash and wound.  Neurological:  Negative for dizziness, weakness, light-headedness, numbness and headaches.  Psychiatric/Behavioral: Negative.    All other systems reviewed and  are negative.   Physical Exam Updated Vital Signs BP (!) 162/87 (BP Location: Right Arm)   Pulse 72   Temp 100.1 F (37.8 C) (Axillary)   Resp 16   Ht '5\' 8"'$  (1.727 m)   Wt 90.7 kg   SpO2 (!) 89%   BMI 30.41 kg/m  Physical Exam Vitals and nursing note reviewed.  Constitutional:      Appearance: He is well-developed.     Comments: Hard of hearing  HENT:     Head: Normocephalic and atraumatic.  Eyes:     Comments: Bilateral lower eyelid extropion.  Cardiovascular:     Rate and Rhythm: Normal rate and regular rhythm.     Heart sounds: Normal heart sounds.  Pulmonary:     Effort: Pulmonary effort is normal.     Breath sounds: Normal breath sounds. No wheezing.  Abdominal:     General: Bowel sounds are normal.     Palpations: Abdomen is soft.     Tenderness: There is no abdominal tenderness. There is no guarding.  Musculoskeletal:        General: No swelling, tenderness or deformity. Normal range of motion.     Cervical back: Normal range of motion.  Skin:    General: Skin is warm and dry.  Neurological:     Mental Status: He is alert. Mental status is at baseline.     ED Results / Procedures / Treatments   Labs (all labs ordered are listed, but only abnormal results are displayed) Labs Reviewed  SARS CORONAVIRUS 2 BY RT PCR - Abnormal; Notable for the following components:      Result Value   SARS Coronavirus 2 by RT PCR POSITIVE (*)    All other components within normal limits  BASIC METABOLIC PANEL - Abnormal; Notable for the following components:   Glucose, Bld 108 (*)    Creatinine, Ser 1.28 (*)    GFR, Estimated 53 (*)    All other components within normal limits  URINALYSIS, ROUTINE W REFLEX MICROSCOPIC - Abnormal; Notable for the following components:   APPearance HAZY (*)    Hgb urine dipstick LARGE (*)    Ketones, ur 5 (*)    Protein, ur 30 (*)    RBC / HPF >50 (*)    All other components within normal limits  LACTIC ACID, PLASMA - Abnormal; Notable  for the following components:   Lactic Acid, Venous 2.4 (*)    All other components within normal limits  LACTIC ACID, PLASMA - Abnormal; Notable for the following components:   Lactic Acid, Venous 2.0 (*)    All other components within normal limits  CBC WITH DIFFERENTIAL/PLATELET - Abnormal; Notable for the following components:   Lymphs Abs 0.5 (*)    All other components within normal limits  CBG MONITORING, ED - Abnormal; Notable for the following components:  Glucose-Capillary 127 (*)    All other components within normal limits    EKG None  Radiology DG Chest Portable 1 View  Result Date: 06/04/2022 CLINICAL DATA:  Fever, weakness EXAM: PORTABLE CHEST 1 VIEW COMPARISON:  08/18/2009 FINDINGS: Transverse diameter of heart is in upper limits of normal. Thoracic aorta is tortuous. Lung fields are clear of any infiltrates or pulmonary edema. There is no pleural effusion or pneumothorax. A catheter is noted in right side of neck and right chest, most likely ventriculoperitoneal shunt. IMPRESSION: There are no signs of pulmonary edema or focal pulmonary consolidation. Electronically Signed   By: Elmer Picker M.D.   On: 06/04/2022 16:29   CT HEAD WO CONTRAST  Result Date: 06/04/2022 CLINICAL DATA:  Head trauma, fall EXAM: CT HEAD WITHOUT CONTRAST TECHNIQUE: Contiguous axial images were obtained from the base of the skull through the vertex without intravenous contrast. RADIATION DOSE REDUCTION: This exam was performed according to the departmental dose-optimization program which includes automated exposure control, adjustment of the mA and/or kV according to patient size and/or use of iterative reconstruction technique. COMPARISON:  06/11/2019 FINDINGS: Brain: No evidence of acute infarction, hemorrhage, hydrocephalus, extra-axial collection or mass lesion/mass effect. Unchanged right parietal approach intraventricular shunt catheter. Extensive periventricular and deep white matter  hypodensity with mild global cerebral volume loss. 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 Vascular: No hyperdense vessel or unexpected calcification. Skull: Normal. Negative for fracture or focal lesion. Sinuses/Orbits: No acute finding. Other: None. IMPRESSION: 1. No acute intracranial pathology. 2. Unchanged right parietal approach intraventricular shunt catheter. No hydrocephalus. 3. Extensive periventricular and deep white matter hypodensity with mild global cerebral volume loss, in keeping with advanced patient age. 4. 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 Electronically Signed   By: Delanna Ahmadi M.D.   On: 06/04/2022 16:23    Procedures Procedures    Medications Ordered in ED Medications  sodium chloride 0.9 % bolus 2,000 mL (0 mLs Intravenous Stopped 06/04/22 1635)  LORazepam (ATIVAN) injection 0.25 mg (0.25 mg Intravenous Given 06/04/22 1551)  cefTRIAXone (ROCEPHIN) 1 g in sodium chloride 0.9 % 100 mL IVPB (1 g Intravenous New Bag/Given 06/04/22 1555)  lactated ringers bolus 700 mL (700 mLs Intravenous New Bag/Given 06/04/22 1632)  LORazepam (ATIVAN) injection 0.25 mg (0.25 mg Intravenous Given 06/04/22 1815)    ED Course/ Medical Decision Making/ A&P Clinical Course as of 06/04/22 1844  Sat Jun 04, 2022  1803 Pt became agitated with attempt to obtain head CT.  Given ativan 0.25 mg with significant improvement in sx.  Agitation escalated again, repeated x 1 now. [JI]    Clinical Course User Index [JI] Landis Martins                           Medical Decision Making Pt with several day history of increasing generalized weakness but also abnormal gait over several days, not lifting the right leg as well but has been ambulatory until today with multiple falls.  Hit head with one fall today, on Eliquis.  CT imaging negative for acute intracranial injury.  During his ed stay here, he developed a low grade fever 100.9,  additional labs including lactate, covid  collected.  Initial lactate 2.4,  pt was given IV fluids,  he was unable to give urine sample until late in course,  in and out cath eventually obtained sample, no acute uti.  He is COVID +.  No acute findings on CXR.  Pt with multiple falls today, attempt to ambulate pt here very difficult with assistance.  He lives at home with his daughter and is not stable for dc home.  No respiratory compromise - O2 saturation documented 89% x 1 but not good waveform,  adjusted device, O2 > 95%.    Amount and/or Complexity of Data Reviewed Labs: ordered.    Details: reviewed, significant for being positive for COVID-19.  He also has a mild bump in his creatinine of 1.28.  Lactic acid was ordered after patient spiked to low-grade fever, initial lactate was 2.4, repeat after receiving IV fluids is 2.0 also improving. Radiology: ordered.    Details: Chest x-ray is negative.  CT head is negative for acute intracranial injury. Discussion of management or test interpretation with external provider(s): Discussed with Dr. Evangeline Gula of the hospitalist service who accepts patient for admission.  Patient does not stable for discharge to care by daughter as he is nonambulatory, multiple falls today secondary to decreased ability to ambulate and weakness.  Risk Prescription drug management. Decision regarding hospitalization.           Final Clinical Impression(s) / ED Diagnoses Final diagnoses:  COVID-19  Weakness    Rx / DC Orders ED Discharge Orders     None         Landis Martins 06/04/22 1849    Carmin Muskrat, MD 06/05/22 1432

## 2022-06-04 NOTE — ED Provider Triage Note (Signed)
Emergency Medicine Provider Triage Evaluation Note  Carlos Ballard , a 88 y.o. male  was evaluated in triage.  Pt complains of generalized increased weakness with multiple falls occurring today.  The third fall involved hitting his head according to daughter at bedside.  No LOC, no nausea or vomiting.  He is on Eliquis for atrial fibrillation.  Has a history of seizure disorder, no documented seizures.  Daughter has noted he is tending to shuffle and not lift his right leg adequately, this is new over the past several days.  Patient has no complaint of pain from injuries.  Review of Systems  Positive: Weakness, altered gait Negative: Fevers, nausea, vomiting  Physical Exam  BP 120/84 (BP Location: Right Arm)   Pulse 94   Temp 97.9 F (36.6 C) (Oral)   Resp 16   Ht '5\' 8"'$  (1.727 m)   Wt 90.7 kg   SpO2 99%   BMI 30.41 kg/m  Gen:   Awake, no distress at baseline per daughter at bedside Resp:  Normal effort  MSK:   Moves extremities without difficulty  Other:  Erythematous lower eyelid margins, history of exotropia on, baseline per daughter  Medical Decision Making  Medically screening exam initiated at 1:40 PM.  Appropriate orders placed.  Carlos Ballard was informed that the remainder of the evaluation will be completed by another provider, this initial triage assessment does not replace that evaluation, and the importance of remaining in the ED until their evaluation is complete.  Labs and imaging have been ordered.   Carlos Jefferson, PA-C 06/04/22 1342

## 2022-06-04 NOTE — ED Notes (Signed)
Patient is currently  resting in bed. Alert and cooperative ativan .25 mg effective

## 2022-06-04 NOTE — ED Notes (Signed)
Patient remains agitated with staff. Redirection remains unsuccessful. Patient unwilling to allow staff to provide care. EDP notified.

## 2022-06-04 NOTE — ED Triage Notes (Signed)
Pt arrives today with daughter (POA). Per daughter, pt has been more weak today than normal and has had multiple falls- daughter states he hit his head, is on Eliquis.  Hx of seizures, hallucinations- pt talking to himself in triage.

## 2022-06-05 DIAGNOSIS — R262 Difficulty in walking, not elsewhere classified: Secondary | ICD-10-CM | POA: Diagnosis not present

## 2022-06-05 DIAGNOSIS — R319 Hematuria, unspecified: Secondary | ICD-10-CM | POA: Diagnosis present

## 2022-06-05 LAB — COMPREHENSIVE METABOLIC PANEL
ALT: 19 U/L (ref 0–44)
AST: 31 U/L (ref 15–41)
Albumin: 3.3 g/dL — ABNORMAL LOW (ref 3.5–5.0)
Alkaline Phosphatase: 66 U/L (ref 38–126)
Anion gap: 8 (ref 5–15)
BUN: 11 mg/dL (ref 8–23)
CO2: 23 mmol/L (ref 22–32)
Calcium: 8.5 mg/dL — ABNORMAL LOW (ref 8.9–10.3)
Chloride: 103 mmol/L (ref 98–111)
Creatinine, Ser: 0.97 mg/dL (ref 0.61–1.24)
GFR, Estimated: >60 mL/min (ref >60)
Glucose, Bld: 86 mg/dL (ref 70–99)
Potassium: 5.3 mmol/L — ABNORMAL HIGH (ref 3.5–5.1)
Sodium: 134 mmol/L — ABNORMAL LOW (ref 135–145)
Total Bilirubin: 2.5 mg/dL — ABNORMAL HIGH (ref 0.3–1.2)
Total Protein: 6.5 g/dL (ref 6.5–8.1)

## 2022-06-05 LAB — CBC WITH DIFFERENTIAL/PLATELET
Abs Immature Granulocytes: 0.02 10*3/uL (ref 0.00–0.07)
Basophils Absolute: 0 10*3/uL (ref 0.0–0.1)
Basophils Relative: 1 %
Eosinophils Absolute: 0 10*3/uL (ref 0.0–0.5)
Eosinophils Relative: 0 %
HCT: 41.8 % (ref 39.0–52.0)
Hemoglobin: 13.7 g/dL (ref 13.0–17.0)
Immature Granulocytes: 0 %
Lymphocytes Relative: 13 %
Lymphs Abs: 0.8 10*3/uL (ref 0.7–4.0)
MCH: 32.6 pg (ref 26.0–34.0)
MCHC: 32.8 g/dL (ref 30.0–36.0)
MCV: 99.5 fL (ref 80.0–100.0)
Monocytes Absolute: 1 10*3/uL (ref 0.1–1.0)
Monocytes Relative: 15 %
Neutro Abs: 4.7 10*3/uL (ref 1.7–7.7)
Neutrophils Relative %: 71 %
Platelets: 138 10*3/uL — ABNORMAL LOW (ref 150–400)
RBC: 4.2 MIL/uL — ABNORMAL LOW (ref 4.22–5.81)
RDW: 12.9 % (ref 11.5–15.5)
WBC: 6.5 10*3/uL (ref 4.0–10.5)
nRBC: 0 % (ref 0.0–0.2)

## 2022-06-05 LAB — FERRITIN: Ferritin: 190 ng/mL (ref 24–336)

## 2022-06-05 LAB — MAGNESIUM: Magnesium: 2.1 mg/dL (ref 1.7–2.4)

## 2022-06-05 LAB — PHOSPHORUS: Phosphorus: 2.6 mg/dL (ref 2.5–4.6)

## 2022-06-05 LAB — C-REACTIVE PROTEIN: CRP: 9.7 mg/dL — ABNORMAL HIGH (ref <1.0)

## 2022-06-05 LAB — PROCALCITONIN: Procalcitonin: <0.1 ng/mL

## 2022-06-05 LAB — D-DIMER, QUANTITATIVE: D-Dimer, Quant: 1.9 ug/mL-FEU — ABNORMAL HIGH (ref 0.00–0.50)

## 2022-06-05 MED ORDER — APIXABAN 5 MG PO TABS: 5.0000 mg | ORAL_TABLET | Freq: Two times a day (BID) | ORAL | Status: DC

## 2022-06-05 MED ORDER — LEVALBUTEROL HCL 0.63 MG/3ML IN NEBU
0.6300 mg | INHALATION_SOLUTION | Freq: Four times a day (QID) | RESPIRATORY_TRACT | Status: DC
Start: 2022-06-05 — End: 2022-06-05
  Administered 2022-06-05: 0.63 mg via RESPIRATORY_TRACT
  Filled 2022-06-05: qty 3

## 2022-06-05 MED ORDER — APIXABAN 5 MG PO TABS
5.0000 mg | ORAL_TABLET | Freq: Two times a day (BID) | ORAL | Status: DC
  Administered 2022-06-05: 5 mg via ORAL
  Filled 2022-06-05: qty 1

## 2022-06-05 MED ORDER — MOLNUPIRAVIR EUA 200MG CAPSULE
4.0000 | ORAL_CAPSULE | Freq: Two times a day (BID) | ORAL | Status: DC
  Filled 2022-06-05: qty 4

## 2022-06-05 MED ORDER — LEVALBUTEROL HCL 0.63 MG/3ML IN NEBU
0.6300 mg | INHALATION_SOLUTION | Freq: Four times a day (QID) | RESPIRATORY_TRACT | Status: DC | PRN
  Administered 2022-06-05 (×2): 0.63 mg via RESPIRATORY_TRACT
  Filled 2022-06-05 (×2): qty 3

## 2022-06-05 MED ORDER — NIRMATRELVIR/RITONAVIR (PAXLOVID) TABLET (RENAL DOSING)
2.0000 | ORAL_TABLET | Freq: Two times a day (BID) | ORAL | Status: AC
  Administered 2022-06-05 – 2022-06-07 (×5): 2 via ORAL
  Filled 2022-06-05: qty 20

## 2022-06-05 MED ORDER — KETOROLAC TROMETHAMINE 15 MG/ML IJ SOLN: 15.0000 mg | Freq: Four times a day (QID) | INTRAMUSCULAR | Status: AC | PRN

## 2022-06-05 MED ORDER — GUAIFENESIN-DM 100-10 MG/5ML PO SYRP
10.0000 mL | ORAL_SOLUTION | Freq: Three times a day (TID) | ORAL | Status: DC
  Administered 2022-06-05 – 2022-06-15 (×19): 10 mL via ORAL
  Filled 2022-06-05 (×28): qty 10

## 2022-06-05 MED ORDER — LEVALBUTEROL HCL 0.63 MG/3ML IN NEBU: 0.6300 mg | INHALATION_SOLUTION | Freq: Four times a day (QID) | RESPIRATORY_TRACT | Status: DC | PRN

## 2022-06-05 MED ORDER — ALBUTEROL SULFATE HFA 108 (90 BASE) MCG/ACT IN AERS
2.0000 | INHALATION_SPRAY | RESPIRATORY_TRACT | Status: DC
  Filled 2022-06-05: qty 6.7

## 2022-06-05 MED ORDER — ZINC SULFATE 220 (50 ZN) MG PO CAPS
220.0000 mg | ORAL_CAPSULE | Freq: Every day | ORAL | Status: DC
  Administered 2022-06-05 – 2022-06-15 (×9): 220 mg via ORAL
  Filled 2022-06-05 (×11): qty 1

## 2022-06-05 MED ORDER — ALBUTEROL SULFATE HFA 108 (90 BASE) MCG/ACT IN AERS: 2.0000 | INHALATION_SPRAY | RESPIRATORY_TRACT | Status: DC | PRN

## 2022-06-05 MED ORDER — APIXABAN 2.5 MG PO TABS: 2.5000 mg | ORAL_TABLET | Freq: Two times a day (BID) | ORAL | Status: DC

## 2022-06-05 MED ORDER — ASCORBIC ACID 500 MG PO TABS
500.0000 mg | ORAL_TABLET | Freq: Every day | ORAL | Status: DC
Start: 2022-06-05 — End: 2022-06-15
  Administered 2022-06-05 – 2022-06-15 (×9): 500 mg via ORAL
  Filled 2022-06-05 (×11): qty 1

## 2022-06-05 MED ORDER — QUETIAPINE FUMARATE 25 MG PO TABS
25.0000 mg | ORAL_TABLET | Freq: Every day | ORAL | Status: DC
  Administered 2022-06-05: 25 mg via ORAL
  Filled 2022-06-05: qty 1

## 2022-06-05 MED ORDER — SODIUM CHLORIDE 0.9 % IV SOLN: INTRAVENOUS | Status: AC

## 2022-06-05 NOTE — Progress Notes (Signed)
Patient is confused and fight 's with neb , refuses mdi. Saturation 97 on room air x-ray clear. Will make treatments  prn , daughter ok with this.

## 2022-06-05 NOTE — Progress Notes (Signed)
   06/05/22 1826  Vitals  BP (!) 153/104  MAP (mmHg) 120  BP Location Left Arm  BP Method Automatic  Patient Position (if appropriate) Sitting  Pulse Rate 100  Pulse Rate Source Monitor  MEWS COLOR  MEWS Score Color Green  Oxygen Therapy  SpO2 100 %  MEWS Score  MEWS Temp 0  MEWS Systolic 0  MEWS Pulse 0  MEWS RR 0  MEWS LOC 0  MEWS Score 0   MD Shahmehdi notified.

## 2022-06-05 NOTE — Progress Notes (Signed)
Patient's still has hematuria. MD Shahmehdi notified.

## 2022-06-05 NOTE — Hospital Course (Addendum)
Carlos Ballard is a 88 y.o. male with medical history significant of atrial fibrillation on Eliquis, stroke, normal pressure hydrocephalus with shunt in place, dementia with occasional agitation, gastroesophageal reflux disease, and hypertension as well as a seizure disorder who presents for evaluation of multiple falls and increased weakness which started over the past 2 days.   Patient lives with his daughter and she states she has not seen his falls but he was on the floor beside his bed this morning when she first saw him.  He had another fall where he simply slid off the end of his bed that he was sitting on today.  He also fell in the bathroom striking the left side of his head but she did not witness that full and heard him and went to him immediately.  There was no loss of consciousness and he does not complain of pain.  He had no noticeable injury with these falls.  He has been eating well today with no fevers, no vomiting, no diarrhea, no complaints of chest pain or abdominal pain, no shortness of breath, no headaches.  He has been shuffling his left foot a little bit more than normally with ambulation and uses a [place] at baseline.  She has noticed this change in his gait over the past several days.   Review of his chart shows that he has small left pontine stroke in 2010 with staring episodes every 6 weeks for which he is treated for as seizures.  History is mostly provided by the patient's daughter.   ED Course:  Pt was Febrile to 100.9.  Positive for COVID-19, lactic acid elevated at 2.4 with a recheck at 2.0.  Chest x-ray negative for pulmonary edema or focal pulmonary consolidation.   CT scan of head showed no acute intracranial pathology chronic volume loss consistent with age and dementia.  Intraventricular shunt was noted with no hydrocephalus.  Received 2 L of IV fluids, Ativan 0.25 mg x 2 doses, ceftriaxone 1 g, 100 mL of lactated Ringer's.  As patient unable to ambulate well and  daughter unable to care for him on her own given his recurrent falls he was referred to me for further evaluation and management    Assessment & Plan:   Assessment/Plan Principal Problem:   Ambulatory dysfunction Active Problems:   COVID-19 virus infection   Atrial fibrillation (HCC)   NPH (normal pressure hydrocephalus) (HCC)   Seizure disorder (HCC)   Dementia without behavioral disturbance (HCC)   Weakness/falls    Ambulatory dysfunction:  -With frequent falls,  - With multiple falls today at least 3.  His daughter is unable to manage him at home with this many falls.  -PT/OT consult for evaluation and recommendations -Fall precautions  -The patient and family agreeable for SNF placement    [place], with multiple falls:  - Likely cause of his ambulatory dysfunction worsened due to COVID-19 infection.  -PT/OT, fall precaution, likely SNF placement    COVID-19 virus infection:  -Currently on room air satting 97% -Minimally symptomatic -We recommend 5 days of isolation -Patient was started on Paxil but, switched to [place] (as patient is on Eliquis,) -Hemodynamically stable, denies any shortness of breath or cough   Atrial fibrillation:  -Continue Eliquis adjusting for AKI    Normal pressure hydrocephalus:  Shunt is intact and CT scan shows it is functioning acceptably. -Currently stable, but increased falls due to weakness   Seizure disorder:  -Sometimes stable, continue home medication of Keppra -Seizure precautions  Dementia without behavioral disturbance:  -With occasional confusion, agitation, sundowning -Family visit and present requested for reorientation - Patient did have some anxiety -continue with as needed Ativan 0.25 mg  -Please schedule Seroquel while at hospital

## 2022-06-05 NOTE — Assessment & Plan Note (Signed)
Acute on chronic hematuria, currently on Eliquis -Worsening hematuria likely from in and out catheter in ED -We will holding Eliquis for now -Pros and cons of holding Eliquis was discussed with the patient daughter at bedside she expressed understanding and agreed

## 2022-06-05 NOTE — Progress Notes (Signed)
Patient is unable/unwilling to use Albuterol MDI, Flutter valve and IS at this time. Xopenex  nebulizers have been administered.

## 2022-06-05 NOTE — Progress Notes (Addendum)
PROGRESS NOTE    Patient: Carlos Ballard                            PCP: Glenda Chroman, MD                    DOB: 1928/07/18            DOA: 06/04/2022 ZOX:[id number]             DOS: 06/05/2022, 1:48 PM   LOS: 1 day   Date of Service: The patient was seen and examined on 06/05/2022  Subjective:   The patient was seen and examined this morning. Hemodynamically stable  Daughter at bedside  Stating he had cough with the breathing treatment has much improved  On room air satting greater 92% Otherwise no issues overnight .  Brief Narrative:   Carlos Ballard is a 88 y.o. male with medical history significant of atrial fibrillation on Eliquis, stroke, normal pressure hydrocephalus with shunt in place, dementia with occasional agitation, gastroesophageal reflux disease, and hypertension as well as a seizure disorder who presents for evaluation of multiple falls and increased weakness which started over the past 2 days.   Patient lives with his daughter and she states she has not seen his falls but he was on the floor beside his bed this morning when she first saw him.  He had another fall where he simply slid off the end of his bed that he was sitting on today.  He also fell in the bathroom striking the left side of his head but she did not witness that full and heard him and went to him immediately.  There was no loss of consciousness and he does not complain of pain.  He had no noticeable injury with these falls.  He has been eating well today with no fevers, no vomiting, no diarrhea, no complaints of chest pain or abdominal pain, no shortness of breath, no headaches.  He has been shuffling his left foot a little bit more than normally with ambulation and uses a [place] at baseline.  She has noticed this change in his gait over the past several days.   Review of his chart shows that he has small left pontine stroke in 2010 with staring episodes every 6 weeks for which he is treated for  as seizures.  History is mostly provided by the patient's daughter.   ED Course:  Pt was Febrile to 100.9.  Positive for COVID-19, lactic acid elevated at 2.4 with a recheck at 2.0.  Chest x-ray negative for pulmonary edema or focal pulmonary consolidation.   CT scan of head showed no acute intracranial pathology chronic volume loss consistent with age and dementia.  Intraventricular shunt was noted with no hydrocephalus.  Received 2 L of IV fluids, Ativan 0.25 mg x 2 doses, ceftriaxone 1 g, 100 mL of lactated Ringer's.  As patient unable to ambulate well and daughter unable to care for him on her own given his recurrent falls he was referred to me for further evaluation and management    Assessment & Plan:   Assessment/Plan Principal Problem:   Ambulatory dysfunction Active Problems:   COVID-19 virus infection   Atrial fibrillation (HCC)   NPH (normal pressure hydrocephalus) (HCC)   Seizure disorder (HCC)   Dementia without behavioral disturbance (HCC)   Weakness/falls    Ambulatory dysfunction:  -With frequent falls,  - With multiple falls  today at least 3.  His daughter is unable to manage him at home with this many falls.  -PT/OT consult for evaluation and recommendations -Fall precautions  -The patient and family agreeable for SNF placement    [place], with multiple falls:  - Likely cause of his ambulatory dysfunction worsened due to COVID-19 infection.  -PT/OT, fall precaution, likely SNF placement    COVID-19 virus infection:  -Currently on room air satting 97% -Minimally symptomatic -We recommend 5 days of isolation -Patient was started on Paxil but, switched to [place] (as patient is on Eliquis,) -Hemodynamically stable, denies any shortness of breath or cough   Atrial fibrillation:  -Continue Eliquis adjusting for AKI    Normal pressure hydrocephalus:  Shunt is intact and CT scan shows it is functioning acceptably. -Currently stable, but  increased falls due to weakness   Seizure disorder:  -Sometimes stable, continue home medication of Keppra -Seizure precautions    Dementia without behavioral disturbance:  -With occasional confusion, agitation, sundowning -Family visit and present requested for reorientation - Patient did have some anxiety -continue with as needed Ativan 0.25 mg  -Please schedule Seroquel while at hospital          ----------------------------------------------------------------------------------------------------------------------------------------------- Nutritional status:  The patient's BMI is: Body mass index is 27.92 kg/m. I agree with the assessment and plan as outlined below: Nutrition Status:        --------------------------------------------------------------------------------------------------------------------------------------------  DVT prophylaxis:  apixaban (ELIQUIS) tablet 2.5 mg Start: 06/07/22 1000 apixaban (ELIQUIS) tablet 2.5 mg   Code Status:   Code Status: DNR  Family Communication: No family member present at bedside- attempt will be made to update daily The above findings and plan of care has been discussed with patient (and family)  in detail,  they expressed understanding and agreement of above. -Advance care planning has been discussed.   Admission status:   Status is: Inpatient Remains inpatient appropriate because: Needing inpatient treatment for COVID, needing evaluation by PT OT, frequent falls     Procedures:   No admission procedures for hospital encounter.   Antimicrobials:  Anti-infectives (From admission, onward)    Start     Dose/Rate Route Frequency Ordered Stop   06/05/22 1345  nirmatrelvir/ritonavir EUA (renal dosing) (PAXLOVID) 2 tablet        2 tablet Oral 2 times daily 06/05/22 1253 06/09/22 2159   06/05/22 1000  molnupiravir EUA (LAGEVRIO) capsule 800 mg  Status:  Discontinued        4 capsule Oral 2 times daily 06/05/22 0753  06/05/22 1253   06/04/22 2200  nirmatrelvir/ritonavir EUA (renal dosing) (PAXLOVID) 2 tablet  Status:  Discontinued        2 tablet Oral 2 times daily 06/04/22 1846 06/05/22 0753   06/04/22 1545  cefTRIAXone (ROCEPHIN) 1 g in sodium chloride 0.9 % 100 mL IVPB        1 g 200 mL/hr over 30 Minutes Intravenous  Once 06/04/22 1539 06/04/22 2035        Medication:   albuterol  2 puff Inhalation Q4H   [START ON 06/07/2022] apixaban  2.5 mg Oral BID   ascorbic acid  500 mg Oral Daily   cholecalciferol  2,000 Units Oral Daily   citalopram  20 mg Oral BID   cyanocobalamin  1,000 mcg Oral Daily   finasteride  5 mg Oral QPM   guaiFENesin-dextromethorphan  10 mL Oral Q8H   levETIRAcetam  1,500 mg Oral BID   melatonin  9 mg Oral QHS  nirmatrelvir/ritonavir EUA (renal dosing)  2 tablet Oral BID   pantoprazole  40 mg Oral Q1200   QUEtiapine  25 mg Oral QHS   zinc sulfate  220 mg Oral Daily    acetaminophen **OR** acetaminophen, chlorpheniramine-HYDROcodone, ketorolac, levalbuterol, LORazepam, ondansetron **OR** ondansetron (ZOFRAN) IV, oxyCODONE, polyethylene glycol, traZODone   Objective:   Vitals:   06/05/22 0118 06/05/22 0434 06/05/22 0500 06/05/22 0937  BP: (!) 150/88 139/85    Pulse: 89 76    Resp:  20    Temp: 99.9 F (37.7 C) 98.8 F (37.1 C)    TempSrc: Axillary     SpO2: 96% 96%  97%  Weight:   83.3 kg   Height:        Intake/Output Summary (Last 24 hours) at 06/05/2022 1348 Last data filed at 06/05/2022 0400 Gross per 24 hour  Intake 3214.75 ml  Output --  Net 3214.75 ml   Filed Weights   06/04/22 1316 06/05/22 0500  Weight: 90.7 kg 83.3 kg     Examination:   Physical Exam  Constitution:  Alert, cooperative, no distress,  Appears calm and comfortable  Psychiatric:   Normal and stable mood and affect, cognition intact, at baseline dementia HEENT:        Normocephalic, PERRL, otherwise with in Normal limits  Chest:         Chest symmetric Cardio vascular:   S1/S2, RRR, No murmure, No Rubs or Gallops  pulmonary: Clear to auscultation bilaterally, respirations unlabored, negative wheezes / crackles Abdomen: Soft, non-tender, non-distended, bowel sounds,no masses, no organomegaly Muscular skeletal: Limited exam -global generalized weaknesses  in bed, able to move all 4 extremities,   Neuro: CNII-XII intact. , normal motor and sensation, reflexes intact  Extremities: No pitting edema lower extremities, +2 pulses  Skin: Dry, warm to touch, negative for any Rashes, No open wounds Wounds: per nursing documentation   ------------------------------------------------------------------------------------------------------------------------------------------    LABs:     Latest Ref Rng & Units 06/05/2022    3:58 AM 06/04/2022    1:43 PM 02/16/2017    4:07 AM  CBC  WBC 4.0 - 10.5 K/uL 6.5  7.3  5.5   Hemoglobin 13.0 - 17.0 g/dL 13.7  15.1  14.4   Hematocrit 39.0 - 52.0 % 41.8  45.8  43.0   Platelets 150 - 400 K/uL 138  160  185       Latest Ref Rng & Units 06/05/2022    3:58 AM 06/04/2022    1:32 PM 02/16/2017    4:07 AM  CMP  Glucose 70 - 99 mg/dL 86  108  108   BUN 8 - 23 mg/dL '11  14  13   '$ Creatinine 0.61 - 1.24 mg/dL 0.97  1.28  1.10   Sodium 135 - 145 mmol/L 134  135  141   Potassium 3.5 - 5.1 mmol/L 5.3  3.8  3.5   Chloride 98 - 111 mmol/L 103  102  108   CO2 22 - 32 mmol/L '23  24  24   '$ Calcium 8.9 - 10.3 mg/dL 8.5  9.1  9.1   Total Protein 6.5 - 8.1 g/dL 6.5     Total Bilirubin 0.3 - 1.2 mg/dL 2.5     Alkaline Phos 38 - 126 U/L 66     AST 15 - 41 U/L 31     ALT 0 - 44 U/L 19          Micro Results Recent Results (from the past 240  hour(s))  SARS Coronavirus 2 by RT PCR (hospital order, performed in [facility] hospital lab) *cepheid single result test* Anterior Nasal Swab     Status: Abnormal   Collection Time: 06/04/22  3:40 PM   Specimen: Anterior Nasal Swab  Result Value Ref Range Status   SARS Coronavirus 2 by RT PCR  POSITIVE (A) NEGATIVE Final    Comment: (NOTE) SARS-CoV-2 target nucleic acids are DETECTED  SARS-CoV-2 RNA is generally detectable in upper respiratory specimens  during the acute phase of infection.  Positive results are indicative  of the presence of the identified virus, but do not rule out bacterial infection or co-infection with other pathogens not detected by the test.  Clinical correlation with patient history and  other diagnostic information is necessary to determine patient infection status.  The expected result is negative.  Fact Sheet for Patients:   https://www.patel.info/   Fact Sheet for Healthcare Providers:   https://hall.com/    This test is not yet approved or cleared by the [place] FDA and  has been authorized for detection and/or diagnosis of SARS-CoV-2 by FDA under an Emergency Use Authorization (EUA).  This EUA will remain in effect (meaning this test can be used) for the duration of  the COVID-19 declaration under Section 564(b)(1)  of the Act, 21 U.S.C. section 360-bbb-3(b)(1), unless the authorization is terminated or revoked sooner.   Performed at [facility], [address], [place], [place] [address]     Radiology Reports DG Chest Portable 1 View  Result Date: 06/04/2022 CLINICAL DATA:  Fever, weakness EXAM: PORTABLE CHEST 1 VIEW COMPARISON:  08/18/2009 FINDINGS: Transverse diameter of heart is in upper limits of normal. Thoracic aorta is tortuous. Lung fields are clear of any infiltrates or pulmonary edema. There is no pleural effusion or pneumothorax. A catheter is noted in right side of neck and right chest, most likely ventriculoperitoneal shunt. IMPRESSION: There are no signs of pulmonary edema or focal pulmonary consolidation. Electronically Signed   By: Elmer Picker M.D.   On: 06/04/2022 16:29   CT HEAD WO CONTRAST  Result Date: 06/04/2022 CLINICAL DATA:  Head trauma, fall EXAM: CT HEAD  WITHOUT CONTRAST TECHNIQUE: Contiguous axial images were obtained from the base of the skull through the vertex without intravenous contrast. RADIATION DOSE REDUCTION: This exam was performed according to the departmental dose-optimization program which includes automated exposure control, adjustment of the mA and/or kV according to patient size and/or use of iterative reconstruction technique. COMPARISON:  06/11/2019 FINDINGS: Brain: No evidence of acute infarction, hemorrhage, hydrocephalus, extra-axial collection or mass lesion/mass effect. Unchanged right parietal approach intraventricular shunt catheter. Extensive periventricular and deep white matter hypodensity with mild global cerebral volume loss. 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 Vascular: No hyperdense vessel or unexpected calcification. Skull: Normal. Negative for fracture or focal lesion. Sinuses/Orbits: No acute finding. Other: None. IMPRESSION: 1. No acute intracranial pathology. 2. Unchanged right parietal approach intraventricular shunt catheter. No hydrocephalus. 3. Extensive periventricular and deep white matter hypodensity with mild global cerebral volume loss, in keeping with advanced patient age. 4. 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 Electronically Signed   By: Delanna Ahmadi M.D.   On: 06/04/2022 16:23    SIGNED: Deatra James, MD, FHM. [facility],  Pager (please use [email or web address] to page/text) Please use Epic Secure Chat for non-urgent communication (7AM-7PM)  If 7PM-7AM, please contact night-coverage [email or web address], 06/05/2022, 1:48 PM

## 2022-06-06 ENCOUNTER — Encounter (HOSPITAL_COMMUNITY): Payer: Self-pay | Admitting: Internal Medicine

## 2022-06-06 DIAGNOSIS — Z515 Encounter for palliative care: Secondary | ICD-10-CM | POA: Diagnosis not present

## 2022-06-06 DIAGNOSIS — R262 Difficulty in walking, not elsewhere classified: Secondary | ICD-10-CM | POA: Diagnosis not present

## 2022-06-06 DIAGNOSIS — F0390 Unspecified dementia without behavioral disturbance: Secondary | ICD-10-CM | POA: Diagnosis not present

## 2022-06-06 DIAGNOSIS — Z7189 Other specified counseling: Secondary | ICD-10-CM

## 2022-06-06 LAB — CBC WITH DIFFERENTIAL/PLATELET
Abs Immature Granulocytes: 0.01 10*3/uL (ref 0.00–0.07)
Basophils Absolute: 0 10*3/uL (ref 0.0–0.1)
Basophils Relative: 1 %
Eosinophils Absolute: 0.1 10*3/uL (ref 0.0–0.5)
Eosinophils Relative: 1 %
HCT: 42.3 % (ref 39.0–52.0)
Hemoglobin: 13.8 g/dL (ref 13.0–17.0)
Immature Granulocytes: 0 %
Lymphocytes Relative: 21 %
Lymphs Abs: 1 10*3/uL (ref 0.7–4.0)
MCH: 32.5 pg (ref 26.0–34.0)
MCHC: 32.6 g/dL (ref 30.0–36.0)
MCV: 99.5 fL (ref 80.0–100.0)
Monocytes Absolute: 1.2 10*3/uL — ABNORMAL HIGH (ref 0.1–1.0)
Monocytes Relative: 24 %
Neutro Abs: 2.5 10*3/uL (ref 1.7–7.7)
Neutrophils Relative %: 53 %
Platelets: 144 10*3/uL — ABNORMAL LOW (ref 150–400)
RBC: 4.25 MIL/uL (ref 4.22–5.81)
RDW: 13.1 % (ref 11.5–15.5)
WBC: 4.8 10*3/uL (ref 4.0–10.5)
nRBC: 0 % (ref 0.0–0.2)

## 2022-06-06 LAB — D-DIMER, QUANTITATIVE: D-Dimer, Quant: 1.89 ug/mL-FEU — ABNORMAL HIGH (ref 0.00–0.50)

## 2022-06-06 LAB — MAGNESIUM: Magnesium: 2.1 mg/dL (ref 1.7–2.4)

## 2022-06-06 LAB — COMPREHENSIVE METABOLIC PANEL
ALT: 15 U/L (ref 0–44)
AST: 18 U/L (ref 15–41)
Albumin: 3.1 g/dL — ABNORMAL LOW (ref 3.5–5.0)
Alkaline Phosphatase: 63 U/L (ref 38–126)
Anion gap: 7 (ref 5–15)
BUN: 11 mg/dL (ref 8–23)
CO2: 25 mmol/L (ref 22–32)
Calcium: 8.6 mg/dL — ABNORMAL LOW (ref 8.9–10.3)
Chloride: 105 mmol/L (ref 98–111)
Creatinine, Ser: 1.07 mg/dL (ref 0.61–1.24)
GFR, Estimated: >60 mL/min (ref >60)
Glucose, Bld: 92 mg/dL (ref 70–99)
Potassium: 3.9 mmol/L (ref 3.5–5.1)
Sodium: 137 mmol/L (ref 135–145)
Total Bilirubin: 0.9 mg/dL (ref 0.3–1.2)
Total Protein: 6.3 g/dL — ABNORMAL LOW (ref 6.5–8.1)

## 2022-06-06 LAB — FERRITIN: Ferritin: 277 ng/mL (ref 24–336)

## 2022-06-06 LAB — PHOSPHORUS: Phosphorus: 2.9 mg/dL (ref 2.5–4.6)

## 2022-06-06 LAB — C-REACTIVE PROTEIN: CRP: 9.8 mg/dL — ABNORMAL HIGH (ref <1.0)

## 2022-06-06 MED ORDER — CITALOPRAM HYDROBROMIDE 20 MG PO TABS
30.0000 mg | ORAL_TABLET | Freq: Two times a day (BID) | ORAL | Status: DC
  Administered 2022-06-06 – 2022-06-07 (×2): 30 mg via ORAL
  Filled 2022-06-06 (×2): qty 2

## 2022-06-06 MED ORDER — QUETIAPINE FUMARATE 25 MG PO TABS
50.0000 mg | ORAL_TABLET | Freq: Every day | ORAL | Status: DC
  Filled 2022-06-06: qty 2

## 2022-06-06 NOTE — Consult Note (Signed)
Consultation Note Date: 06/06/2022   Patient Name: Carlos Ballard  DOB: 08/21/1928  MRN: [id number]  Age / Sex: 88 y.o., male  PCP: Glenda Chroman, MD Referring Physician: Deatra James, MD  Reason for Consultation: Establishing goals of care  HPI/Patient Profile: 88 y.o. male  with past medical history of A-fib on Eliquis, seizure disorder, dementia with occasional agitation, stroke, normal pressure hydrocephalus with shunt in place, AAA, Bell's palsy, malignant polyp of the colon 2007, history of melanoma 2009, lumbago, GERD, HTN, admitted on 06/04/2022 with weakness and falls, incidental COVID.  Clinical Assessment and Goals of Care: I have reviewed medical records including EPIC notes, labs and imaging, received report from RN, assessed the patient.  [name] is lying quietly in bed.  He appears acutely/chronically ill and somewhat frail.  He is sleepy, but will wake when I shake his arm.  He has known dementia, but is able to tell me his name.  I am not sure that he can make his basic needs known.  His daughter, Carlos Ballard, is present at bedside.    We meet at bedside to discuss diagnosis prognosis, GOC, EOL wishes, disposition and options.  I introduced Palliative Medicine as specialized medical care for people living with serious illness. It focuses on providing relief from the symptoms and stress of a serious illness. The goal is to improve quality of life for both the patient and the family.  We discussed a brief life review of the patient.  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  His wife has been deceased almost 58 years.  His daughter Carlos Ballard has been living in his home for approximately 12 years.  Generally, [name] is able to do his own dressing, he needs prompting and some assistance with bathing.  Myra manages IADLs.  We focused on their current illness.  We talk about [name] poor  mobility and falls.  We talk about incidental COVID finding.  We talk about PT consult for recommendations.  As we are finishing our consult physical therapy arrives for evaluation.  The natural disease trajectory and expectations at EOL were discussed.    Advanced directives, concepts specific to code status, artifical feeding and hydration, and rehospitalization were considered and discussed.  Myra states that [name] had intended through the years that he would not want to be kept alive artificially.  She discussed with his primary care doctor and they chose DNR.  We talk about the concept of "let nature take its course".  Hospice and Palliative Care services outpatient were explained and offered.  We talk about the benefits for outpatient palliative services, hospice services when the time is right.  More discussions to be held on follow-up visits.  Discussed the importance of continued conversation with family and the medical providers regarding overall plan of care and treatment options, ensuring decisions are within the context of the patient's values and GOCs.  Questions and concerns were addressed.   The family was encouraged to call with questions or  concerns.  PMT will continue to support holistically.  Conference with attending, bedside nursing staff, transition of care team related to patient condition, needs, goals of care, disposition.   HCPOA  NEXT OF KIN -[name] is a widower.  He has 1 child, daughter [name], who is his healthcare surrogate.    SUMMARY OF RECOMMENDATIONS   At this point continue to treat the treatable but no CPR or intubation Time for outcomes Preference would be to return home if [name] is able to have good mobility.   Code Status/Advance Care Planning: DNR  Symptom Management:  Per hospitalist, no additional needs at this time.  Palliative Prophylaxis:  Frequent Pain Assessment and Oral Care  Additional Recommendations (Limitations,  Scope, Preferences): Treat the treatable but no CPR or intubation  Psycho-social/Spiritual:  Desire for further Chaplaincy support:no Additional Recommendations: Caregiving  Support/Resources and Education on Hospice  Prognosis:  Unable to determine, based on outcomes.  6 months or less would not be surprising based on chronic illness burden, decreasing functional status.  Discharge Planning: Goal is to return home if possible, may need short-term rehab      Primary Diagnoses: Present on Admission:  Ambulatory dysfunction  COVID-19 virus infection  Atrial fibrillation (HCC)  NPH (normal pressure hydrocephalus) (HCC)  Dementia without behavioral disturbance (HCC)  Hematuria   I have reviewed the medical record, interviewed the patient and family, and examined the patient. The following aspects are pertinent.  Past Medical History:  Diagnosis Date   AAA (abdominal aortic aneurysm) (HCC)    Incisional Hernia   Abdominal pain    Allergic rhinitis, cause unspecified    Atrial fibrillation (HCC)    Bell's palsy    BPH (benign prostatic hyperplasia)    Cancer (HCC)    colon   Colon polyp    Malignant   Dementia ([place])    Frequency of urination and polyuria    Hyperlipidemia    Hypertension    Kidney stones    Lumbago    Malaise and fatigue    Melanoma ([place])    Obstructive hydrocephalus ([place])    Seizure ([place]) 12/01   Stroke ([place]) 02/14/2017   Social History   Socioeconomic History   Marital status: Widowed    Spouse name: Not on file   Number of children: 1   Years of education: 12th   Highest education level: Not on file  Occupational History   Occupation: Retired    Comment: TV Sales  Tobacco Use   Smoking status: Former    Types: Cigars    Quit date: 06/20/2009    Years since quitting: 12.9   Smokeless tobacco: Never  Substance and Sexual Activity   Alcohol use: No   Drug use: No   Sexual activity: Not on file  Other Topics Concern   Not on file   Social History Narrative   Pt lives at home with his daughter, Carlos Ballard.   Caffeine Use: 1/2 caff coffee- 1/2- 1 pot daily in winter   Social Determinants of Health   Financial Resource Strain: Not on file  Food Insecurity: Not on file  Transportation Needs: Not on file  Physical Activity: Not on file  Stress: Not on file  Social Connections: Not on file   Family History  Problem Relation Age of Onset   Cancer Mother        ovarian cancer   Hypertension Father    Scheduled Meds:  ascorbic acid  500 mg Oral Daily  cholecalciferol  2,000 Units Oral Daily   citalopram  30 mg Oral BID   cyanocobalamin  1,000 mcg Oral Daily   finasteride  5 mg Oral QPM   guaiFENesin-dextromethorphan  10 mL Oral Q8H   levETIRAcetam  1,500 mg Oral BID   melatonin  9 mg Oral QHS   nirmatrelvir/ritonavir EUA (renal dosing)  2 tablet Oral BID   pantoprazole  40 mg Oral Q1200   QUEtiapine  50 mg Oral QHS   zinc sulfate  220 mg Oral Daily   Continuous Infusions: PRN Meds:.acetaminophen **OR** acetaminophen, albuterol, chlorpheniramine-HYDROcodone, ketorolac, levalbuterol, LORazepam, ondansetron **OR** ondansetron (ZOFRAN) IV, oxyCODONE, polyethylene glycol Medications Prior to Admission:  Prior to Admission medications   Medication Sig Start Date End Date Taking? Authorizing Provider  Cholecalciferol 50 MCG (2000 UT) CAPS Take 1 capsule by mouth daily.   Yes [provider]  citalopram (CELEXA) 20 MG tablet Take 20 mg by mouth 2 (two) times daily. 05/14/22  Yes [provider]  cyanocobalamin (VITAMIN B12) 1000 MCG tablet Take 1,000 mcg by mouth daily.   Yes [provider]  diltiazem (CARDIZEM CD) 240 MG 24 hr capsule Take 240 mg by mouth at bedtime. 03/21/13  Yes [provider]  ELIQUIS 5 MG TABS tablet Take 5 mg by mouth 2 (two) times daily. 05/02/22  Yes [provider]  esomeprazole (NEXIUM) 40 MG capsule Take 20 mg by mouth daily as needed (acid reflux).    Yes [provider]  finasteride (PROSCAR) 5 MG tablet Take 5 mg by mouth every evening.    Yes [provider]  KEPPRA 750 MG tablet Take 2 tablets (1,500 mg total) by mouth 2 (two) times daily. Brand medically necessary 03/15/18  Yes Penumalli, Earlean Polka, MD  loratadine (CLARITIN) 10 MG tablet Take 10 mg by mouth daily as needed for allergies.   Yes [provider]  losartan (COZAAR) 100 MG tablet Take 100 mg by mouth daily. 05/04/22  Yes [provider]  melatonin 5 MG TABS Take 2.5-10 mg by mouth at bedtime. Takes 2.5 mg in the afternoon and 10 mg at bedtime.   Yes [provider]  rosuvastatin (CRESTOR) 5 MG tablet Take 5 mg by mouth once a week. Takes on Fridays. 05/30/22  Yes [provider]   Allergies  Allergen Reactions   Depakote [Divalproex Sodium] Swelling and Other (See Comments)     low blood counts   Phenytek [Phenytoin Sodium Extended] Other (See Comments)    ineffective   Review of Systems  Unable to perform ROS: Dementia    Physical Exam Vitals and nursing note reviewed.  Constitutional:      General: He is not in acute distress.    Appearance: He is ill-appearing.  Cardiovascular:     Rate and Rhythm: Normal rate.  Pulmonary:     Effort: Pulmonary effort is normal. No respiratory distress.  Skin:    General: Skin is warm and dry.  Neurological:     Comments: Known dementia  Psychiatric:     Comments: Calm and cooperative, periods of aggression     Vital Signs: BP 112/85   Pulse 74   Temp 98.7 F (37.1 C) (Axillary)   Resp 18   Ht '5\' 8"'$  (1.727 m)   Wt 83.3 kg Comment: bed  SpO2 97%   BMI 27.92 kg/m  Pain Scale: 0-10   Pain Score: 0-No pain   SpO2: SpO2: 97 % O2 Device:SpO2: 97 % O2 Flow Rate: .  IO: Intake/output summary:  Intake/Output Summary (Last 24 hours) at 06/06/2022 1355 Last data filed at 06/06/2022 1300 Gross per 24 hour  Intake 1284.56 ml  Output 1900 ml  Net -615.44 ml     LBM:   Baseline Weight: Weight: 90.7 kg Most recent weight: Weight: 83.3 kg (bed)     Palliative Assessment/Data:   Flowsheet Rows    Flowsheet Row Most Recent Value  Intake Tab   Referral Department Hospitalist  Unit at Time of Referral Cardiac/Telemetry Unit  Palliative Care Primary Diagnosis Other (Comment)  Date Notified 06/06/22  Palliative Care Type New Palliative care  Reason for referral Clarify Goals of Care  Date of Admission 06/04/22  Date first seen by Palliative Care 06/06/22  # of days Palliative referral response time 0 Day(s)  # of days IP prior to Palliative referral 2  Clinical Assessment   Palliative Performance Scale Score 40%  Pain Max last 24 hours Not able to report  Pain Min Last 24 hours Not able to report  Dyspnea Max Last 24 Hours Not able to report  Dyspnea Min Last 24 hours Not able to report  Psychosocial & Spiritual Assessment   Palliative Care Outcomes        Time In: 1120 Time Out: 1235 Time Total: 75 minutes  Greater than 50%  of this time was spent counseling and coordinating care related to the above assessment and plan.  Signed by: Drue Novel, NP   Please contact Palliative Medicine Team phone at [phone] for questions and concerns.  For individual provider: See Shea Evans

## 2022-06-06 NOTE — Progress Notes (Signed)
PROGRESS NOTE    Patient: Carlos Ballard                            PCP: Glenda Chroman, MD                    DOB: [birth year]            DOA: 06/04/2022 [id number]             DOS: 06/06/2022, 11:46 AM   LOS: 2 days   Date of Service: The patient was seen and examined on 06/06/2022  Subjective:   The patient was seen and examined this morning, awake alert x2 pleasantly demented in no acute distress, cooperative -Hemodynamically stable On room air satting 97%   Agitation, confusion overnight  Brief Narrative:   Carlos Ballard is a 88 y.o. male with medical history significant of atrial fibrillation on Eliquis, stroke, normal pressure hydrocephalus with shunt in place, dementia with occasional agitation, gastroesophageal reflux disease, and hypertension as well as a seizure disorder who presents for evaluation of multiple falls and increased weakness which started over the past 2 days.   Patient lives with his daughter and she states she has not seen his falls but he was on the floor beside his bed this morning when she first saw him.  He had another fall where he simply slid off the end of his bed that he was sitting on today.  He also fell in the bathroom striking the left side of his head but she did not witness that full and heard him and went to him immediately.  There was no loss of consciousness and he does not complain of pain.  He had no noticeable injury with these falls.  He has been eating well today with no fevers, no vomiting, no diarrhea, no complaints of chest pain or abdominal pain, no shortness of breath, no headaches.  He has been shuffling his left foot a little bit more than normally with ambulation and uses a Levinson at baseline.  She has noticed this change in his gait over the past several days.   Review of his chart shows that he has small left pontine stroke in 2010 with staring episodes every 6 weeks for which he is treated for as seizures.  History is  mostly provided by the patient's daughter.   ED Course:  Pt was Febrile to 100.9.  Positive for COVID-19, lactic acid elevated at 2.4 with a recheck at 2.0.  Chest x-ray negative for pulmonary edema or focal pulmonary consolidation.   CT scan of head showed no acute intracranial pathology chronic volume loss consistent with age and dementia.  Intraventricular shunt was noted with no hydrocephalus.  Received 2 L of IV fluids, Ativan 0.25 mg x 2 doses, ceftriaxone 1 g, 100 mL of lactated Ringer's.  As patient unable to ambulate well and daughter unable to care for him on her own given his recurrent falls he was referred to me for further evaluation and management    Assessment & Plan:   Assessment/Plan Principal Problem:   Ambulatory dysfunction Active Problems:   COVID-19 virus infection   Atrial fibrillation (HCC)   NPH (normal pressure hydrocephalus) (HCC)   Seizure disorder (HCC)   Dementia without behavioral disturbance (HCC)   Weakness/falls    Ambulatory dysfunction:  -Fall precautions -With frequent falls,  - With multiple falls today at least 3.  His  daughter is unable to manage him at home with this many falls.  -PT/OT consult for evaluation and recommendations -The patient and family agreeable for SNF placement    [place], with multiple falls:  - Likely cause of his ambulatory dysfunction worsened due to COVID-19 infection.  -PT/OT, fall precaution, likely SNF placement (Unfortunately per TOC patient needs to be in the hospital for total of 11 days, but he will complete his treatment in next 3 days for COVID)    COVID-19 virus infection:  -Stable on room air, satting 97% -Minimally symptomatic -We recommend 5 days of isolation -Per family request patient was restarted on Paxlovid   -We will holding Eliquis due to hematuria (dosage needs to be adjusted if patient remains Paxlovid) -Hemodynamically stable, denies any shortness of breath or cough   Atrial  fibrillation:  -Rate controlled, but holding Eliquis due to hematuria, and Paxlovid    Normal pressure hydrocephalus:  -Stable - Shunt is intact and CT scan shows it is functioning acceptably.    Seizure disorder:  -Sometimes stable, continue home medication of Keppra -Seizure precautions    Dementia without behavioral disturbance:  -Sundowning with confusion agitation -Family visit and present requested for reorientation - Patient did have some anxiety -continue with as needed Ativan 0.25 mg  -Started and will increase Seroquel to 50 mg p.o. nightly          ----------------------------------------------------------------------------------------------------------------------------------------------- Nutritional status:  The patient's BMI is: Body mass index is 27.92 kg/m. I agree with the assessment and plan as outlined --------------------------------------------------------------------------------------------------------------------------------------------  DVT prophylaxis:  Place and maintain sequential compression device Start: 06/05/22 1350   Code Status:   Code Status: DNR  Family Communication: Discussed and updated daughter at bedside The above findings and plan of care has been discussed with patient (and family)  in detail,  they expressed understanding and agreement of above. -Advance care planning has been discussed.   Admission status:   Status is: Inpatient Remains inpatient appropriate because: Needing inpatient treatment for COVID, needing evaluation by PT OT, frequent falls  Disposition: Patient is from home will be discharged to SNF Unfortunately due to COVID diagnosis patient has to remain in hospital for total of 11 days since admission) Since patient has been on since his COVID after completing treatment anticipating discontinuing isolation   Procedures:   No admission procedures for hospital encounter.   Antimicrobials:   Anti-infectives (From admission, onward)    Start     Dose/Rate Route Frequency Ordered Stop   06/05/22 1345  nirmatrelvir/ritonavir EUA (renal dosing) (PAXLOVID) 2 tablet        2 tablet Oral 2 times daily 06/05/22 1253 06/09/22 2159   06/05/22 1000  molnupiravir EUA (LAGEVRIO) capsule 800 mg  Status:  Discontinued        4 capsule Oral 2 times daily 06/05/22 0753 06/05/22 1253   06/04/22 2200  nirmatrelvir/ritonavir EUA (renal dosing) (PAXLOVID) 2 tablet  Status:  Discontinued        2 tablet Oral 2 times daily 06/04/22 1846 06/05/22 0753   06/04/22 1545  cefTRIAXone (ROCEPHIN) 1 g in sodium chloride 0.9 % 100 mL IVPB        1 g 200 mL/hr over 30 Minutes Intravenous  Once 06/04/22 1539 06/04/22 2035        Medication:   ascorbic acid  500 mg Oral Daily   cholecalciferol  2,000 Units Oral Daily   citalopram  30 mg Oral BID   cyanocobalamin  1,000 mcg Oral Daily  finasteride  5 mg Oral QPM   guaiFENesin-dextromethorphan  10 mL Oral Q8H   levETIRAcetam  1,500 mg Oral BID   melatonin  9 mg Oral QHS   nirmatrelvir/ritonavir EUA (renal dosing)  2 tablet Oral BID   pantoprazole  40 mg Oral Q1200   QUEtiapine  50 mg Oral QHS   zinc sulfate  220 mg Oral Daily    acetaminophen **OR** acetaminophen, albuterol, chlorpheniramine-HYDROcodone, ketorolac, levalbuterol, LORazepam, ondansetron **OR** ondansetron (ZOFRAN) IV, oxyCODONE, polyethylene glycol   Objective:   Vitals:   06/05/22 2017 06/05/22 2214 06/06/22 0605 06/06/22 0835  BP:  (!) 140/101 123/68 112/85  Pulse:  100 83 74  Resp:  17 18   Temp:  98.6 F (37 C) 98.7 F (37.1 C)   TempSrc:  Axillary Axillary   SpO2: 97% 93% 94% 97%  Weight:      Height:        Intake/Output Summary (Last 24 hours) at 06/06/2022 1146 Last data filed at 06/06/2022 0800 Gross per 24 hour  Intake 804.56 ml  Output 1900 ml  Net -1095.44 ml   Filed Weights   06/04/22 1316 06/05/22 0500  Weight: 90.7 kg 83.3 kg     Examination:       Physical Exam:   General:  Pleasantly demented male AAO x 2,  cooperative, no distress;   HEENT:  Normocephalic, PERRL, otherwise with in Normal limits   Neuro:  CNII-XII intact. , normal motor and sensation, reflexes intact   Lungs:   Clear to auscultation BL, Respirations unlabored,  No wheezes / crackles  Cardio:    S1/S2, RRR, No murmure, No Rubs or Gallops   Abdomen:  Soft, non-tender, bowel sounds active all four quadrants, no guarding or peritoneal signs.  Muscular  skeletal:  Limited exam -global generalized weaknesses - in bed, able to move all 4 extremities,   2+ pulses,  symmetric, No pitting edema  Skin:  Dry, warm to touch, negative for any Rashes,  Wounds: Please see nursing documentation         ------------------------------------------------------------------------------------------------------------------------------------------    LABs:     Latest Ref Rng & Units 06/06/2022    4:50 AM 06/05/2022    3:58 AM 06/04/2022    1:43 PM  CBC  WBC 4.0 - 10.5 K/uL 4.8  6.5  7.3   Hemoglobin 13.0 - 17.0 g/dL 13.8  13.7  15.1   Hematocrit 39.0 - 52.0 % 42.3  41.8  45.8   Platelets 150 - 400 K/uL 144  138  160       Latest Ref Rng & Units 06/06/2022    4:50 AM 06/05/2022    3:58 AM 06/04/2022    1:32 PM  CMP  Glucose 70 - 99 mg/dL 92  86  108   BUN 8 - 23 mg/dL '11  11  14   '$ Creatinine 0.61 - 1.24 mg/dL 1.07  0.97  1.28   Sodium 135 - 145 mmol/L 137  134  135   Potassium 3.5 - 5.1 mmol/L 3.9  5.3  3.8   Chloride 98 - 111 mmol/L 105  103  102   CO2 22 - 32 mmol/L '25  23  24   '$ Calcium 8.9 - 10.3 mg/dL 8.6  8.5  9.1   Total Protein 6.5 - 8.1 g/dL 6.3  6.5    Total Bilirubin 0.3 - 1.2 mg/dL 0.9  2.5    Alkaline Phos 38 - 126 U/L 63  66  AST 15 - 41 U/L 18  31    ALT 0 - 44 U/L 15  19         Micro Results Recent Results (from the past 240 hour(s))  SARS Coronavirus 2 by RT PCR (hospital order, performed in [facility] hospital lab) *cepheid single  result test* Anterior Nasal Swab     Status: Abnormal   Collection Time: 06/04/22  3:40 PM   Specimen: Anterior Nasal Swab  Result Value Ref Range Status   SARS Coronavirus 2 by RT PCR POSITIVE (A) NEGATIVE Final    Comment: (NOTE) SARS-CoV-2 target nucleic acids are DETECTED  SARS-CoV-2 RNA is generally detectable in upper respiratory specimens  during the acute phase of infection.  Positive results are indicative  of the presence of the identified virus, but do not rule out bacterial infection or co-infection with other pathogens not detected by the test.  Clinical correlation with patient history and  other diagnostic information is necessary to determine patient infection status.  The expected result is negative.  Fact Sheet for Patients:   https://www.patel.info/   Fact Sheet for Healthcare Providers:   https://hall.com/    This test is not yet approved or cleared by the [place] FDA and  has been authorized for detection and/or diagnosis of SARS-CoV-2 by FDA under an Emergency Use Authorization (EUA).  This EUA will remain in effect (meaning this test can be used) for the duration of  the COVID-19 declaration under Section 564(b)(1)  of the Act, 21 U.S.C. section 360-bbb-3(b)(1), unless the authorization is terminated or revoked sooner.   Performed at [facility], [address] [place], [place] [address]     Radiology Reports No results found.  SIGNED: Deatra James, MD, FHM. [facility],  Pager (please use [email or web address] to page/text) Please use Epic Secure Chat for non-urgent communication (7AM-7PM)  If 7PM-7AM, please contact night-coverage [email or web address], 06/06/2022, 11:46 AM

## 2022-06-06 NOTE — TOC Progression Note (Addendum)
  Transition of Care [facility]) Screening Note   Patient Details  Name: Carlos Ballard Date of Birth: [birth year]   Transition of [facility]) CM/SW Contact:    Shade Flood, LCSW Phone Number: 06/06/2022, 12:23 PM  Received [facility] consult for SNF. PT has completed eval and states there are no follow up PT needs. MD feels pt does have SNF needs. He will ask PT to re-evaluate.  Transition of Care Department [facility]) has reviewed patient and no TOC needs have been identified at this time. We will continue to monitor patient advancement through interdisciplinary progression rounds. If new patient transition needs arise, please place a TOC consult.

## 2022-06-06 NOTE — Progress Notes (Signed)
PT Cancellation Note  Patient Details Name: Lucian Baswell MRN: [id number] DOB: [birth year]   Cancelled Treatment:    Reason Eval/Treat Not Completed: Patient declined, no reason specified.  Physical therapy attempted, but patient became agitated and would not participate, his daughter present during visit - nurse notified.   2:07 PM, 06/06/22 Lonell Grandchild, MPT Physical Therapist with [facility] [phone] office [phone] mobile phone

## 2022-06-07 DIAGNOSIS — R262 Difficulty in walking, not elsewhere classified: Secondary | ICD-10-CM | POA: Diagnosis not present

## 2022-06-07 LAB — URINE CULTURE

## 2022-06-07 LAB — COMPREHENSIVE METABOLIC PANEL
ALT: 15 U/L (ref 0–44)
AST: 17 U/L (ref 15–41)
Albumin: 2.9 g/dL — ABNORMAL LOW (ref 3.5–5.0)
Alkaline Phosphatase: 62 U/L (ref 38–126)
Anion gap: 5 (ref 5–15)
BUN: 12 mg/dL (ref 8–23)
CO2: 25 mmol/L (ref 22–32)
Calcium: 8.5 mg/dL — ABNORMAL LOW (ref 8.9–10.3)
Chloride: 106 mmol/L (ref 98–111)
Creatinine, Ser: 1.04 mg/dL (ref 0.61–1.24)
GFR, Estimated: >60 mL/min (ref >60)
Glucose, Bld: 76 mg/dL (ref 70–99)
Potassium: 3.5 mmol/L (ref 3.5–5.1)
Sodium: 136 mmol/L (ref 135–145)
Total Bilirubin: 0.9 mg/dL (ref 0.3–1.2)
Total Protein: 6.1 g/dL — ABNORMAL LOW (ref 6.5–8.1)

## 2022-06-07 LAB — CBC WITH DIFFERENTIAL/PLATELET
Abs Immature Granulocytes: 0.01 10*3/uL (ref 0.00–0.07)
Basophils Absolute: 0 10*3/uL (ref 0.0–0.1)
Basophils Relative: 1 %
Eosinophils Absolute: 0.2 10*3/uL (ref 0.0–0.5)
Eosinophils Relative: 6 %
HCT: 42.6 % (ref 39.0–52.0)
Hemoglobin: 14.2 g/dL (ref 13.0–17.0)
Immature Granulocytes: 0 %
Lymphocytes Relative: 29 %
Lymphs Abs: 0.9 10*3/uL (ref 0.7–4.0)
MCH: 32.6 pg (ref 26.0–34.0)
MCHC: 33.3 g/dL (ref 30.0–36.0)
MCV: 97.7 fL (ref 80.0–100.0)
Monocytes Absolute: 0.7 10*3/uL (ref 0.1–1.0)
Monocytes Relative: 22 %
Neutro Abs: 1.3 10*3/uL — ABNORMAL LOW (ref 1.7–7.7)
Neutrophils Relative %: 42 %
Platelets: 136 10*3/uL — ABNORMAL LOW (ref 150–400)
RBC: 4.36 MIL/uL (ref 4.22–5.81)
RDW: 13 % (ref 11.5–15.5)
WBC: 3.1 10*3/uL — ABNORMAL LOW (ref 4.0–10.5)
nRBC: 0 % (ref 0.0–0.2)

## 2022-06-07 LAB — D-DIMER, QUANTITATIVE: D-Dimer, Quant: 2.27 ug/mL-FEU — ABNORMAL HIGH (ref 0.00–0.50)

## 2022-06-07 LAB — C-REACTIVE PROTEIN: CRP: 4.8 mg/dL — ABNORMAL HIGH (ref <1.0)

## 2022-06-07 LAB — FERRITIN: Ferritin: 296 ng/mL (ref 24–336)

## 2022-06-07 LAB — PHOSPHORUS: Phosphorus: 2.6 mg/dL (ref 2.5–4.6)

## 2022-06-07 LAB — MAGNESIUM: Magnesium: 2 mg/dL (ref 1.7–2.4)

## 2022-06-07 MED ORDER — QUETIAPINE FUMARATE 25 MG PO TABS
25.0000 mg | ORAL_TABLET | Freq: Every day | ORAL | Status: DC
  Administered 2022-06-07 – 2022-06-13 (×7): 25 mg via ORAL
  Filled 2022-06-07 (×8): qty 1

## 2022-06-07 MED ORDER — APIXABAN 5 MG PO TABS
5.0000 mg | ORAL_TABLET | Freq: Two times a day (BID) | ORAL | Status: DC
Start: 2022-06-10 — End: 2022-06-15
  Administered 2022-06-10 – 2022-06-15 (×10): 5 mg via ORAL
  Filled 2022-06-07 (×11): qty 1

## 2022-06-07 MED ORDER — CITALOPRAM HYDROBROMIDE 20 MG PO TABS
30.0000 mg | ORAL_TABLET | Freq: Every day | ORAL | Status: DC
  Administered 2022-06-08 – 2022-06-15 (×8): 30 mg via ORAL
  Filled 2022-06-07 (×10): qty 2

## 2022-06-07 MED ORDER — APIXABAN 2.5 MG PO TABS
2.5000 mg | ORAL_TABLET | Freq: Two times a day (BID) | ORAL | Status: AC
  Administered 2022-06-07 – 2022-06-09 (×5): 2.5 mg via ORAL
  Filled 2022-06-07 (×5): qty 1

## 2022-06-07 NOTE — Plan of Care (Signed)
  Problem: Acute Rehab PT Goals(only PT should resolve) Goal: Pt Will Go Supine/Side To Sit Outcome: Progressing Flowsheets (Taken 06/07/2022 1223) Pt will go Supine/Side to Sit:  with supervision  with modified independence Goal: Patient Will Transfer Sit To/From Stand Outcome: Progressing Flowsheets (Taken 06/07/2022 1223) Patient will transfer sit to/from stand: with min guard assist Goal: Pt Will Transfer Bed To Chair/Chair To Bed Outcome: Progressing Flowsheets (Taken 06/07/2022 1223) Pt will Transfer Bed to Chair/Chair to Bed: min guard assist Goal: Pt Will Ambulate Outcome: Progressing Flowsheets (Taken 06/07/2022 1223) Pt will Ambulate:  50 feet  with minimal assist  with min guard assist  with rolling Yung   12:24 PM, 06/07/22 Lonell Grandchild, MPT Physical Therapist with [facility] 336 [phone] office [phone] mobile phone

## 2022-06-07 NOTE — Progress Notes (Signed)
PROGRESS NOTE    Patient: Carlos Ballard                            PCP: Glenda Chroman, MD                    DOB: 26-Oct-1927            DOA: 06/04/2022 LFY:[id number]             DOS: 06/07/2022, 2:51 PM   LOS: 3 days   Date of Service: The patient was seen and examined on 06/07/2022  Subjective:   The patient was seen and examined this morning, hemodynamically stable, in no respiratory distress, on room air.  Agitation, confusion overnight  Daughter at bedside involved in the patient's care requesting discontinuing Paxil, restarting Eliquis, reducing Seroquel dose  Daughter is aware that it may take patient up to 7 more days before we are able to place the patient at SNF  Brief Narrative:   Carlos Ballard is a 88 y.o. male with medical history significant of atrial fibrillation on Eliquis, stroke, normal pressure hydrocephalus with shunt in place, dementia with occasional agitation, gastroesophageal reflux disease, and hypertension as well as a seizure disorder who presents for evaluation of multiple falls and increased weakness which started over the past 2 days.   Patient lives with his daughter and she states she has not seen his falls but he was on the floor beside his bed this morning when she first saw him.  He had another fall where he simply slid off the end of his bed that he was sitting on today.  He also fell in the bathroom striking the left side of his head but she did not witness that full and heard him and went to him immediately.  There was no loss of consciousness and he does not complain of pain.  He had no noticeable injury with these falls.  He has been eating well today with no fevers, no vomiting, no diarrhea, no complaints of chest pain or abdominal pain, no shortness of breath, no headaches.  He has been shuffling his left foot a little bit more than normally with ambulation and uses a Gilmer at baseline.  She has noticed this change in his gait over the past  several days.   Review of his chart shows that he has small left pontine stroke in 2010 with staring episodes every 6 weeks for which he is treated for as seizures.  History is mostly provided by the patient's daughter.   ED Course:  Pt was Febrile to 100.9.  Positive for COVID-19, lactic acid elevated at 2.4 with a recheck at 2.0.  Chest x-ray negative for pulmonary edema or focal pulmonary consolidation.   CT scan of head showed no acute intracranial pathology chronic volume loss consistent with age and dementia.  Intraventricular shunt was noted with no hydrocephalus.  Received 2 L of IV fluids, Ativan 0.25 mg x 2 doses, ceftriaxone 1 g, 100 mL of lactated Ringer's.  As patient unable to ambulate well and daughter unable to care for him on her own given his recurrent falls he was referred to me for further evaluation and management    Assessment & Plan:   Assessment/Plan Principal Problem:   Ambulatory dysfunction Active Problems:   COVID-19 virus infection   Atrial fibrillation (HCC)   NPH (normal pressure hydrocephalus) (HCC)   Seizure disorder ([place])  Dementia without behavioral disturbance (HCC)   Weakness/falls    Ambulatory dysfunction:  -Fall precautions -With frequent falls,  - With multiple falls today at least 3.  His daughter is unable to manage him at home with this many falls.  -PT/OT recommended--SNF -The patient and family agreeable for SNF placement    [place], with multiple falls:  - Likely cause of his ambulatory dysfunction worsened due to COVID-19 infection.  -PT/OT, fall precaution, likely SNF placement (Unfortunately per TOC patient needs to be in the hospital for total of 11 days) Patient remained asymptomatic, off COVID treatment afebrile... We may discontinue isolation sooner --possibly 5 days    COVID-19 virus infection:  -Patient remained stable, asymptomatic, afebrile, normotensive, improved cough and congestion -Improving inflammatory  markers -Initially daughter requested patient to be on Paxil but now she is requesting for to be discontinued -Paxil was discontinued 06/07/2022     Atrial fibrillation:  -Rate controlled, but holding Eliquis due to hematuria, and Paxlovid -Hematuria has improved, Paxlovid discontinued -Daughter requests restarting Eliquis    Normal pressure hydrocephalus:  -Stable - Shunt is intact and CT scan shows it is functioning acceptably.    Seizure disorder:  -Sometimes stable, continue home medication of Keppra -Seizure precautions    Hematuria  -Acute on chronic, exacerbated by in and out Foley cath in ED -Eliquis was held briefly -Hematuria has improved-   Dementia without behavioral disturbance:  -Sundowning with confusion agitation -Family visit and present requested for reorientation - Patient did have some anxiety -continue with as needed Ativan 0.25 mg  -Started and will increase Seroquel to 50 mg p.o. nightly     Ethics: DNR/DNI, palliative care was consulted, daughter directing goals of care Due to patient's advanced age and multiple comorbidities, prognosis remain poor.          ----------------------------------------------------------------------------------------------------------------------------------------------- Nutritional status:  The patient's BMI is: Body mass index is 29 kg/m. I agree with the assessment and plan as outlined --------------------------------------------------------------------------------------------------------------------------------------------  DVT prophylaxis:  apixaban (ELIQUIS) tablet 2.5 mg Start: 06/07/22 1800 Place and maintain sequential compression device Start: 06/05/22 1350 apixaban (ELIQUIS) tablet 2.5 mg  apixaban (ELIQUIS) tablet 5 mg   Code Status:   Code Status: DNR  Family Communication: Discussed and updated daughter at bedside The above findings and plan of care has been discussed with patient (and  family)  in detail,  they expressed understanding and agreement of above. -Advance care planning has been discussed.   Admission status:   Status is: Inpatient Remains inpatient appropriate because: Needing inpatient treatment for COVID, needing evaluation by PT OT, frequent falls  Disposition: Patient is from home will be discharged to SNF Unfortunately due to COVID diagnosis patient has to remain in hospital for total of 11 days from day of diagnosis. Patient remains asymptomatic from a COVID standpoint, we may discontinue isolation sooner...    Procedures:   No admission procedures for hospital encounter.   Antimicrobials:  Anti-infectives (From admission, onward)    Start     Dose/Rate Route Frequency Ordered Stop   06/05/22 1345  nirmatrelvir/ritonavir EUA (renal dosing) (PAXLOVID) 2 tablet        2 tablet Oral 2 times daily 06/05/22 1253 06/09/22 2159   06/05/22 1000  molnupiravir EUA (LAGEVRIO) capsule 800 mg  Status:  Discontinued        4 capsule Oral 2 times daily 06/05/22 0753 06/05/22 1253   06/04/22 2200  nirmatrelvir/ritonavir EUA (renal dosing) (PAXLOVID) 2 tablet  Status:  Discontinued  2 tablet Oral 2 times daily 06/04/22 1846 06/05/22 0753   06/04/22 1545  cefTRIAXone (ROCEPHIN) 1 g in sodium chloride 0.9 % 100 mL IVPB        1 g 200 mL/hr over 30 Minutes Intravenous  Once 06/04/22 1539 06/04/22 2035        Medication:   apixaban  2.5 mg Oral BID   [START ON 06/10/2022] apixaban  5 mg Oral BID   ascorbic acid  500 mg Oral Daily   cholecalciferol  2,000 Units Oral Daily   [START ON 06/08/2022] citalopram  30 mg Oral Daily   cyanocobalamin  1,000 mcg Oral Daily   finasteride  5 mg Oral QPM   guaiFENesin-dextromethorphan  10 mL Oral Q8H   levETIRAcetam  1,500 mg Oral BID   melatonin  9 mg Oral QHS   nirmatrelvir/ritonavir EUA (renal dosing)  2 tablet Oral BID   pantoprazole  40 mg Oral Q1200   QUEtiapine  25 mg Oral QHS   zinc sulfate  220 mg Oral  Daily    acetaminophen **OR** acetaminophen, albuterol, chlorpheniramine-HYDROcodone, levalbuterol, LORazepam, ondansetron **OR** ondansetron (ZOFRAN) IV, oxyCODONE, polyethylene glycol   Objective:   Vitals:   06/06/22 2003 06/07/22 0500 06/07/22 0601 06/07/22 1249  BP: (!) 149/96  (!) 157/95 (!) 133/94  Pulse: 63  63 93  Resp: 18  20   Temp: (!) 97.4 F (36.3 C)     TempSrc:      SpO2: 97%  98% 99%  Weight:  86.5 kg    Height:        Intake/Output Summary (Last 24 hours) at 06/07/2022 1451 Last data filed at 06/07/2022 1253 Gross per 24 hour  Intake 240 ml  Output --  Net 240 ml   Filed Weights   06/04/22 1316 06/05/22 0500 06/07/22 0500  Weight: 90.7 kg 83.3 kg 86.5 kg     Examination:     Physical Exam:   General:   pleasantly demented AAO x 1,  cooperative, no distress;   HEENT:  Normocephalic, PERRL, otherwise with in Normal limits   Neuro:  CNII-XII intact. , normal motor and sensation, reflexes intact   Lungs:   Clear to auscultation BL, Respirations unlabored,  No wheezes / crackles  Cardio:    S1/S2, RRR, No murmure, No Rubs or Gallops   Abdomen:  Soft, non-tender, bowel sounds active all four quadrants, no guarding or peritoneal signs.  Muscular  skeletal:  Limited exam -global generalized weaknesses - in bed, able to move all 4 extremities,   2+ pulses,  symmetric, No pitting edema  Skin:  Dry, warm to touch, negative for any Rashes,  Wounds: Please see nursing documentation           ------------------------------------------------------------------------------------------------------------------------------------------    LABs:     Latest Ref Rng & Units 06/07/2022    3:41 AM 06/06/2022    4:50 AM 06/05/2022    3:58 AM  CBC  WBC 4.0 - 10.5 K/uL 3.1  4.8  6.5   Hemoglobin 13.0 - 17.0 g/dL 14.2  13.8  13.7   Hematocrit 39.0 - 52.0 % 42.6  42.3  41.8   Platelets 150 - 400 K/uL 136  144  138       Latest Ref Rng & Units 06/07/2022     3:41 AM 06/06/2022    4:50 AM 06/05/2022    3:58 AM  CMP  Glucose 70 - 99 mg/dL 76  92  86   BUN 8 -  23 mg/dL '12  11  11   '$ Creatinine 0.61 - 1.24 mg/dL 1.04  1.07  0.97   Sodium 135 - 145 mmol/L 136  137  134   Potassium 3.5 - 5.1 mmol/L 3.5  3.9  5.3   Chloride 98 - 111 mmol/L 106  105  103   CO2 22 - 32 mmol/L '25  25  23   '$ Calcium 8.9 - 10.3 mg/dL 8.5  8.6  8.5   Total Protein 6.5 - 8.1 g/dL 6.1  6.3  6.5   Total Bilirubin 0.3 - 1.2 mg/dL 0.9  0.9  2.5   Alkaline Phos 38 - 126 U/L 62  63  66   AST 15 - 41 U/L '17  18  31   '$ ALT 0 - 44 U/L '15  15  19        '$ Micro Results Recent Results (from the past 240 hour(s))  SARS Coronavirus 2 by RT PCR (hospital order, performed in [place] hospital lab) *cepheid single result test* Anterior Nasal Swab     Status: Abnormal   Collection Time: 06/04/22  3:40 PM   Specimen: Anterior Nasal Swab  Result Value Ref Range Status   SARS Coronavirus 2 by RT PCR POSITIVE (A) NEGATIVE Final    Comment: (NOTE) SARS-CoV-2 target nucleic acids are DETECTED  SARS-CoV-2 RNA is generally detectable in upper respiratory specimens  during the acute phase of infection.  Positive results are indicative  of the presence of the identified virus, but do not rule out bacterial infection or co-infection with other pathogens not detected by the test.  Clinical correlation with patient history and  other diagnostic information is necessary to determine patient infection status.  The expected result is negative.  Fact Sheet for Patients:   https://www.patel.info/   Fact Sheet for Healthcare Providers:   https://hall.com/    This test is not yet approved or cleared by the [place] FDA and  has been authorized for detection and/or diagnosis of SARS-CoV-2 by FDA under an Emergency Use Authorization (EUA).  This EUA will remain in effect (meaning this test can be used) for the duration of  the COVID-19 declaration  under Section 564(b)(1)  of the Act, 21 U.S.C. section 360-bbb-3(b)(1), unless the authorization is terminated or revoked sooner.   Performed at [facility], [address] [place], [place] [address]   Urine Culture     Status: Abnormal   Collection Time: 06/04/22  5:00 PM   Specimen: Urine, Clean Catch  Result Value Ref Range Status   Specimen Description   Final    URINE, CLEAN CATCH Performed at [facility], [address], [place], [place] [address]    Special Requests   Final    NONE Performed at [facility], [address], [place], [place] [address]    Culture MULTIPLE SPECIES PRESENT, SUGGEST RECOLLECTION (A)  Final   Report Status 06/07/2022 FINAL  Final    Radiology Reports No results found.  SIGNED: Deatra James, MD, FHM. [facility],  Pager (please use [email or web address] to page/text) Please use Epic Secure Chat for non-urgent communication (7AM-7PM)  If 7PM-7AM, please contact night-coverage [email or web address], 06/07/2022, 2:51 PM

## 2022-06-07 NOTE — Evaluation (Signed)
Physical Therapy Evaluation Patient Details Name: Hobson Lax MRN: [id number] DOB: 01-24-1928 Today's Date: 06/07/2022  History of Present Illness  Ringo Sherod is a 88 y.o. male with medical history significant of atrial fibrillation on Eliquis, stroke, normal pressure hydrocephalus with shunt in place, dementia with occasional agitation, gastroesophageal reflux disease, and hypertension as well as a seizure disorder who presents for evaluation of multiple falls and increased weakness which started over the past 2 days.  Patient lives with his daughter and she states she has not seen his falls but he was on the floor beside his bed this morning when she first saw him.  He had another fall where he simply slid off the end of his bed that he was sitting on today.  He also fell in the bathroom striking the left side of his head but she did not witness that full and heard him and went to him immediately.  There was no loss of consciousness and he does not complain of pain.  He had no noticeable injury with these falls.  He has been eating well today with no fevers, no vomiting, no diarrhea, no complaints of chest pain or abdominal pain, no shortness of breath, no headaches.  He has been shuffling his left foot a little bit more than normally with ambulation and uses a [place] at baseline.  She has noticed this change in his gait over the past several days.  Review of his chart shows that he has small left pontine stroke in 2010 with staring episodes every 6 weeks for which he is treated for as seizures.  History is mostly provided by the patient's daughter.   Clinical Impression  Patient cooperative and able to follow directions with occasional repeated verbal/tactile cueing.  Patient demonstrates slow labored movement for sitting up at bedside with [facility] raised, unsteady on feet with frequent dragging/shuffling of RLE when taking steps with RW during transfer to chair and when walking in room.   Patient limited for gait training mostly due to fatigue/generalized weakness and tolerated sitting up in chair after therapy with his daughter present in room.  Patient will benefit from continued skilled physical therapy in hospital and recommended venue below to increase strength, balance, endurance for safe ADLs and gait.         Recommendations for follow up therapy are one component of a multi-disciplinary discharge planning process, led by the attending physician.  Recommendations may be updated based on patient status, additional functional criteria and insurance authorization.  Follow Up Recommendations Skilled nursing-short term rehab (<3 hours/day)      Assistance Recommended at Discharge Intermittent Supervision/Assistance  Patient can return home with the following  A lot of help with bathing/dressing/bathroom;A lot of help with walking and/or transfers;Assistance with cooking/housework;Help with stairs or ramp for entrance    Equipment Recommendations None recommended by PT  Recommendations for Other Services       Functional Status Assessment Patient has had a recent decline in their functional status and demonstrates the ability to make significant improvements in function in a reasonable and predictable amount of time.     Precautions / Restrictions Precautions Precautions: Fall Restrictions Weight Bearing Restrictions: No      Mobility  Bed Mobility Overal bed mobility: Needs Assistance Bed Mobility: Supine to Sit     Supine to sit: HOB elevated, Min assist     General bed mobility comments: slow labored movement requiring HOB raised    Transfers Overall transfer level: Needs  assistance Equipment used: Rolling Wilinski (2 wheels) Transfers: Sit to/from Stand, Bed to chair/wheelchair/BSC Sit to Stand: Mod assist   Step pivot transfers: Mod assist       General transfer comment: increased time, labored movement    Ambulation/Gait Ambulation/Gait  assistance: Mod assist Gait Distance (Feet): 18 Feet Assistive device: Rolling Zawistowski (2 wheels) Gait Pattern/deviations: Decreased step length - left, Decreased stride length, Decreased step length - right, Shuffle, Trunk flexed, Decreased stance time - right Gait velocity: decreased     General Gait Details: slow labored cadence with decreased step/stride length RLE with occasional shuffling, limited mostly due to fatigue and generalized weakness  Stairs            Wheelchair Mobility    Modified Rankin (Stroke Patients Only)       Balance Overall balance assessment: Needs assistance Sitting-balance support: Feet supported, No upper extremity supported Sitting balance-Leahy Scale: Good Sitting balance - Comments: seated at EOB   Standing balance support: During functional activity, Bilateral upper extremity supported, Reliant on assistive device for balance Standing balance-Leahy Scale: Poor Standing balance comment: fair/poor using RW                             Pertinent Vitals/Pain Pain Assessment Pain Assessment: No/denies pain    Home Living Family/patient expects to be discharged to:: Private residence Living Arrangements: Children Available Help at Discharge: Family;Available 24 hours/day Type of Home: House Home Access: Level entry       Home Layout: One level Home Equipment: Conservation officer, nature (2 wheels);Shower seat;Grab bars - toilet;Wheelchair - manual      Prior Function Prior Level of Function : Needs assist       Physical Assist : ADLs (physical);Mobility (physical) Mobility (physical): Bed mobility;Transfers;Gait;Stairs   Mobility Comments: household ambulator using RW ADLs Comments: Needs assist with all ADL's and IADLs.     Hand Dominance   Dominant Hand: Right    Extremity/Trunk Assessment   Upper Extremity Assessment Upper Extremity Assessment: Defer to OT evaluation    Lower Extremity Assessment Lower Extremity  Assessment: Generalized weakness    Cervical / Trunk Assessment Cervical / Trunk Assessment: Kyphotic  Communication   Communication: HOH  Cognition Arousal/Alertness: Awake/alert Behavior During Therapy: WFL for tasks assessed/performed Overall Cognitive Status: History of cognitive impairments - at baseline                                 General Comments: Dementia with occasional agitation        General Comments General comments (skin integrity, edema, etc.): VSS on RA, pleasantly demented this session.    Exercises     Assessment/Plan    PT Assessment Patient needs continued PT services  PT Problem List Decreased activity tolerance;Decreased strength;Decreased balance;Decreased mobility       PT Treatment Interventions DME instruction;Gait training;Stair training;Functional mobility training;Therapeutic activities;Therapeutic exercise;Patient/family education;Balance training    PT Goals (Current goals can be found in the Care Plan section)  Acute Rehab PT Goals Patient Stated Goal: return home with family to assist PT Goal Formulation: With patient/family Time For Goal Achievement: 06/21/22 Potential to Achieve Goals: Good    Frequency Min 3X/week     Co-evaluation               AM-PAC PT "6 Clicks" Mobility  Outcome Measure Help needed turning from your back to  your side while in a flat bed without using bedrails?: A Little Help needed moving from lying on your back to sitting on the side of a flat bed without using bedrails?: A Little Help needed moving to and from a bed to a chair (including a wheelchair)?: A Lot Help needed standing up from a chair using your arms (e.g., wheelchair or bedside chair)?: A Lot Help needed to walk in hospital room?: A Lot Help needed climbing 3-5 steps with a railing? : A Lot 6 Click Score: 14    End of Session   Activity Tolerance: Patient tolerated treatment well;Patient limited by fatigue Patient  left: in chair;with call bell/phone within reach;with family/visitor present Nurse Communication: Mobility status PT Visit Diagnosis: Unsteadiness on feet (R26.81);Other abnormalities of gait and mobility (R26.89);Muscle weakness (generalized) (M62.81)    Time: 4585-9292 PT Time Calculation (min) (ACUTE ONLY): 20 min   Charges:   PT Evaluation $PT Eval Moderate Complexity: 1 Mod PT Treatments $Therapeutic Activity: 8-22 mins        12:22 PM, 06/07/22 Lonell Grandchild, MPT Physical Therapist with [facility] [phone] office [phone] mobile phone

## 2022-06-07 NOTE — Progress Notes (Signed)
ANTICOAGULATION CONSULT NOTE - Initial Consult  Pharmacy Consult for apixaban Indication: atrial fibrillation  Allergies  Allergen Reactions   Depakote [Divalproex Sodium] Swelling and Other (See Comments)     low blood counts   Phenytek [Phenytoin Sodium Extended] Other (See Comments)    ineffective    Patient Measurements: Height: '5\' 8"'$  (172.7 cm) Weight: 86.5 kg (190 lb 11.2 oz) IBW/kg (Calculated) : 68.4 Heparin Dosing Weight:   Vital Signs: BP: 157/95 (08/22 0601) Pulse Rate: 63 (08/22 0601)  Labs: Recent Labs    06/05/22 0358 06/06/22 0450 06/07/22 0341  HGB 13.7 13.8 14.2  HCT 41.8 42.3 42.6  PLT 138* 144* 136*  CREATININE 0.97 1.07 1.04    Estimated Creatinine Clearance: 48.5 mL/min (by C-G formula based on SCr of 1.04 mg/dL).   Medical History: Past Medical History:  Diagnosis Date   AAA (abdominal aortic aneurysm) (HCC)    Incisional Hernia   Abdominal pain    Allergic rhinitis, cause unspecified    Atrial fibrillation (HCC)    Bell's palsy    BPH (benign prostatic hyperplasia)    Cancer (HCC)    colon   Colon polyp    Malignant   Dementia (HCC)    Frequency of urination and polyuria    Hyperlipidemia    Hypertension    Kidney stones    Lumbago    Malaise and fatigue    Melanoma ([place])    Obstructive hydrocephalus ([place])    Seizure ([place]) 12/01   Stroke ([place]) 02/14/2017    Medications:  Medications Prior to Admission  Medication Sig Dispense Refill Last Dose   Cholecalciferol 50 MCG (2000 UT) CAPS Take 1 capsule by mouth daily.   06/04/2022   citalopram (CELEXA) 20 MG tablet Take 20 mg by mouth 2 (two) times daily.   06/04/2022   cyanocobalamin (VITAMIN B12) 1000 MCG tablet Take 1,000 mcg by mouth daily.   06/04/2022   diltiazem (CARDIZEM CD) 240 MG 24 hr capsule Take 240 mg by mouth at bedtime.   06/03/2022   ELIQUIS 5 MG TABS tablet Take 5 mg by mouth 2 (two) times daily.   06/04/2022 at 0730   esomeprazole (NEXIUM) 40 MG capsule Take 20 mg  by mouth daily as needed (acid reflux).   unknown   finasteride (PROSCAR) 5 MG tablet Take 5 mg by mouth every evening.    06/03/2022   KEPPRA 750 MG tablet Take 2 tablets (1,500 mg total) by mouth 2 (two) times daily. Brand medically necessary 360 tablet 4 06/04/2022   loratadine (CLARITIN) 10 MG tablet Take 10 mg by mouth daily as needed for allergies.   unknown   losartan (COZAAR) 100 MG tablet Take 100 mg by mouth daily.   06/04/2022   melatonin 5 MG TABS Take 2.5-10 mg by mouth at bedtime. Takes 2.5 mg in the afternoon and 10 mg at bedtime.   06/03/2022   rosuvastatin (CRESTOR) 5 MG tablet Take 5 mg by mouth once a week. Takes on Fridays.   06/03/2022     Plan:  Apixaban 2.5 mg BID thru 8/24 while patient is on Paxlovid due to drug-drug interaction. Then back to apixaban 5 mg BID after.  Revonda Standard Fawzi Melman 06/07/2022,12:41 PM

## 2022-06-07 NOTE — TOC Initial Note (Signed)
Transition of Care [facility]) - Initial/Assessment Note    Patient Details  Name: Quadir Muns MRN: [id number] Date of Birth: 27-Jul-1928  Transition of Care [facility]) CM/SW Contact:    Shade Flood, LCSW Phone Number: 06/07/2022, 2:38 PM  Clinical Narrative:                  PT recommending SNF rehab at dc. Spoke with pt's daughter who is agreeable. Due to Covid diagnosis (on 06/04/22), pt will have to be 10 days post diagnosis before a SNF can admit. Dtr aware. CMS provider options reviewed. Will refer as requested.   TOC will follow.  Expected Discharge Plan: Skilled Nursing Facility Barriers to Discharge: Continued Medical Work up   Patient Goals and CMS Choice Patient states their goals for this hospitalization and ongoing recovery are:: get better CMS Medicare.gov Compare Post Acute Care list provided to:: Patient Represenative (must comment) Choice offered to / list presented to : Adult Children  Expected Discharge Plan and Services Expected Discharge Plan: [place] In-house Referral: Clinical Social Work   Post Acute Care Choice: [place] Living arrangements for the past 2 months: [place]                                      Prior Living Arrangements/Services Living arrangements for the past 2 months: Single Family Home Lives with:: Adult Children Patient language and need for interpreter reviewed:: Yes Do you feel safe going back to the place where you live?: Yes      Need for Family Participation in Patient Care: Yes (Comment) Care giver support system in place?: Yes (comment)   Criminal Activity/Legal Involvement Pertinent to Current Situation/Hospitalization: No - Comment as needed  Activities of Daily Living Home Assistive Devices/Equipment: Mehringer (specify type) ADL Screening (condition at time of admission) Patient's cognitive ability adequate to safely complete daily activities?: Yes Is the patient deaf  or have difficulty hearing?: Yes Does the patient have difficulty seeing, even when wearing glasses/contacts?: No Does the patient have difficulty concentrating, remembering, or making decisions?: Yes Patient able to express need for assistance with ADLs?: No Does the patient have difficulty dressing or bathing?: Yes Independently performs ADLs?: No Communication: Dependent Is this a change from baseline?: Pre-admission baseline Dressing (OT): Dependent Is this a change from baseline?: Pre-admission baseline Grooming: Dependent Is this a change from baseline?: Pre-admission baseline Feeding: Independent Bathing: Dependent Is this a change from baseline?: Pre-admission baseline Toileting: Dependent Is this a change from baseline?: Pre-admission baseline In/Out Bed: Dependent Is this a change from baseline?: Pre-admission baseline Walks in Home: Independent with device (comment) ([place]) Does the patient have difficulty walking or climbing stairs?: Yes Weakness of Legs: Both Weakness of Arms/Hands: None  Permission Sought/Granted Permission sought to share information with : Facility Art therapist granted to share information with : Yes, Verbal Permission Granted     Permission granted to share info w AGENCY: SNF        Emotional Assessment       Orientation: : Oriented to Self Alcohol / Substance Use: Not Applicable Psych Involvement: No (comment)  Admission diagnosis:  Weakness [R53.1] COVID-19 [U07.1] Patient Active Problem List   Diagnosis Date Noted   Hematuria 06/05/2022   Ambulatory dysfunction 06/04/2022   COVID-19 virus infection 06/04/2022   Weakness 06/04/2022   Right sided weakness 02/15/2017   TIA (transient ischemic attack)  02/15/2017   GERD (gastroesophageal reflux disease) 02/15/2017   Essential hypertension 02/15/2017   Right leg weakness    Atrial fibrillation ([place]) 12/16/2013   Stroke ([place]) 12/16/2013   Dementia without  behavioral disturbance ([place]) 12/16/2013   NPH (normal pressure hydrocephalus) ([place]) 03/27/2013   Seizure disorder ([place]) 03/27/2013   PCP:  Glenda Chroman, MD Pharmacy:   [place] [place] [address] Phone: [phone] Fax: [phone]  [facility] ([place], [place] [facility] [address] [address] Phone: [phone] Fax: [phone]     Social Determinants of Health (SDOH) Interventions    Readmission Risk Interventions     No data to display

## 2022-06-07 NOTE — Evaluation (Signed)
Occupational Therapy Evaluation Patient Details Name: Carlos Ballard MRN: [id number] DOB: [birth year] Today's Date: 06/07/2022   History of Present Illness 88 y.o. M admitted on 06/04/22 due to multiple falls and difficulty ambulating. CT negative. Pt found to be Covid +. PMH significant for [place], Afib, CVA (2010), HTN, GERD,hydrocephalus.   Clinical Impression   Pt admitted for concerns listed above. PTA pt's daughter reported that he was fairly ambulatory with RW, only needing supervision. He was requiring a lot of assist for bathing and min guard assist for dressing, and daughter does all IADL's for the patient. At this time, pt presents with increased weakness and balance deficits. He is requiring mod A +1-2 for stand pivot transfers and up to max A +1-2 for ADL's. Recommending SNF at this time to maximize independence and safety, and reduce caregiver burden. OT will continue to follow acutely.       Recommendations for follow up therapy are one component of a multi-disciplinary discharge planning process, led by the attending physician.  Recommendations may be updated based on patient status, additional functional criteria and insurance authorization.   Follow Up Recommendations  Skilled nursing-short term rehab (<3 hours/day)    Assistance Recommended at Discharge Frequent or constant Supervision/Assistance  Patient can return home with the following A lot of help with bathing/dressing/bathroom;Two people to help with walking and/or transfers;Two people to help with bathing/dressing/bathroom;Assistance with cooking/housework;Direct supervision/assist for medications management    Functional Status Assessment  Patient has had a recent decline in their functional status and demonstrates the ability to make significant improvements in function in a reasonable and predictable amount of time.  Equipment Recommendations  None recommended by OT    Recommendations for Other Services        Precautions / Restrictions Precautions Precautions: Fall Restrictions Weight Bearing Restrictions: No      Mobility Bed Mobility Overal bed mobility: Needs Assistance Bed Mobility: Sit to Supine       Sit to supine: Min assist, HOB elevated   General bed mobility comments: Min A to bring BLE into bed and reposition    Transfers Overall transfer level: Needs assistance Equipment used: Rolling Ryback (2 wheels) Transfers: Sit to/from Stand, Bed to chair/wheelchair/BSC Sit to Stand: Mod assist Stand pivot transfers: Mod assist, +2 safety/equipment         General transfer comment: Pt able to power up with min A however, needing mod A to steady and +2 helpful for safe pivot transfer due to cognition      Balance Overall balance assessment: Needs assistance Sitting-balance support: No upper extremity supported, Feet supported Sitting balance-Leahy Scale: Good     Standing balance support: Bilateral upper extremity supported, Reliant on assistive device for balance Standing balance-Leahy Scale: Poor                             ADL either performed or assessed with clinical judgement   ADL Overall ADL's : Needs assistance/impaired Eating/Feeding: Set up;Sitting   Grooming: Minimal assistance;Sitting   Upper Body Bathing: Moderate assistance;Sitting   Lower Body Bathing: Maximal assistance;+2 for safety/equipment;Sit to/from stand;Sitting/lateral leans   Upper Body Dressing : Minimal assistance;Sitting   Lower Body Dressing: Moderate assistance;+2 for safety/equipment;Sit to/from stand;Sitting/lateral leans   Toilet Transfer: Moderate assistance;Stand-pivot   Toileting- Clothing Manipulation and Hygiene: Maximal assistance;+2 for safety/equipment;Sit to/from stand       Functional mobility during ADLs: Moderate assistance;Rolling Vasil (2 wheels) General ADL Comments:  Limited by difficulty wiht LLE and balance deficits, as well as cognitive  deficits     Vision Baseline Vision/History: 0 No visual deficits Ability to See in Adequate Light: 1 Impaired Patient Visual Report: No change from baseline Vision Assessment?: No apparent visual deficits     Perception     Praxis      Pertinent Vitals/Pain Pain Assessment Pain Assessment: No/denies pain     Hand Dominance Right   Extremity/Trunk Assessment Upper Extremity Assessment Upper Extremity Assessment: Generalized weakness   Lower Extremity Assessment Lower Extremity Assessment: Defer to PT evaluation   Cervical / Trunk Assessment Cervical / Trunk Assessment: Kyphotic   Communication Communication Communication: HOH   Cognition Arousal/Alertness: Awake/alert Behavior During Therapy: WFL for tasks assessed/performed Overall Cognitive Status: History of cognitive impairments - at baseline                                 General Comments: Dementia with occasional agitation     General Comments  VSS on RA, pleasantly demented this session.    Exercises     Shoulder Instructions      Home Living Family/patient expects to be discharged to:: Private residence Living Arrangements: Children Available Help at Discharge: Family;Available 24 hours/day Type of Home: House Home Access: Level entry     Home Layout: One level     Bathroom Shower/Tub: Sponge bathes at baseline   Bathroom Toilet: Standard     Home Equipment: Conservation officer, nature (2 wheels);Shower seat;Grab bars - toilet;Wheelchair - manual          Prior Functioning/Environment Prior Level of Function : Needs assist;History of Falls (last six months)             Mobility Comments: Uses a RW for mobility, occasionally forgets and furniture walks. Recently presenting with difficulty ambulating needing increased assist and multiple falls. ADLs Comments: Needs assist with all ADL's and IADLs.        OT Problem List: Decreased strength;Decreased activity tolerance;Impaired  balance (sitting and/or standing);Decreased safety awareness;Decreased knowledge of use of DME or AE;Decreased cognition;Decreased coordination      OT Treatment/Interventions: Self-care/ADL training;Therapeutic exercise;Energy conservation;DME and/or AE instruction;Therapeutic activities;Patient/family education;Balance training    OT Goals(Current goals can be found in the care plan section) Acute Rehab OT Goals Patient Stated Goal: To go home OT Goal Formulation: With family Time For Goal Achievement: 06/21/22 Potential to Achieve Goals: Good ADL Goals Pt Will Perform Grooming: with set-up;sitting Pt Will Perform Lower Body Bathing: with min assist;sitting/lateral leans;sit to/from stand Pt Will Perform Lower Body Dressing: with min assist;sitting/lateral leans;sit to/from stand Pt Will Transfer to Toilet: with min assist;ambulating Pt Will Perform Toileting - Clothing Manipulation and hygiene: with min assist;sitting/lateral leans;sit to/from stand  OT Frequency: Min 1X/week    Co-evaluation              AM-PAC OT "6 Clicks" Daily Activity     Outcome Measure Help from another person eating meals?: A Little Help from another person taking care of personal grooming?: A Little Help from another person toileting, which includes using toliet, bedpan, or urinal?: A Lot Help from another person bathing (including washing, rinsing, drying)?: A Lot Help from another person to put on and taking off regular upper body clothing?: A Little Help from another person to put on and taking off regular lower body clothing?: A Lot 6 Click Score: 15   End of Session  Equipment Utilized During Treatment: Rolling Gugel (2 wheels) Nurse Communication: Mobility status  Activity Tolerance: Patient tolerated treatment well Patient left: in bed;with call bell/phone within reach;with bed alarm set;with family/visitor present;with nursing/sitter in room  OT Visit Diagnosis: Unsteadiness on feet  (R26.81);Other abnormalities of gait and mobility (R26.89);Muscle weakness (generalized) (M62.81)                Time: 3837-7939 OT Time Calculation (min): 23 min Charges:  OT General Charges $OT Visit: 1 Visit OT Evaluation $OT Eval Moderate Complexity: 1 Mod OT Treatments $Self Care/Home Management : 8-22 mins  Heliodoro Domagalski H., OTR/L Acute Rehabilitation  Brennon Otterness Elane Natori Gudino 06/07/2022, 9:30 AM

## 2022-06-07 NOTE — Plan of Care (Signed)
Pt disoriented x 4. Med compliant with daughters help. Daughter refused seroquel due to interaction with mediations. Pt rested during overnight. Vitals stable. Respirations even unlabored lungs clear diminished.  Problem: Education: Goal: Knowledge of risk factors and measures for prevention of condition will improve Outcome: Progressing   Problem: Coping: Goal: Psychosocial and spiritual needs will be supported Outcome: Progressing   Problem: Respiratory: Goal: Will maintain a patent airway Outcome: Progressing Goal: Complications related to the disease process, condition or treatment will be avoided or minimized Outcome: Progressing   Problem: Education: Goal: Knowledge of General Education information will improve Description: Including pain rating scale, medication(s)/side effects and non-pharmacologic comfort measures Outcome: Progressing   Problem: Health Behavior/Discharge Planning: Goal: Ability to manage health-related needs will improve Outcome: Progressing   Problem: Clinical Measurements: Goal: Ability to maintain clinical measurements within normal limits will improve Outcome: Progressing Goal: Will remain free from infection Outcome: Progressing Goal: Diagnostic test results will improve Outcome: Progressing Goal: Respiratory complications will improve Outcome: Progressing Goal: Cardiovascular complication will be avoided Outcome: Progressing   Problem: Activity: Goal: Risk for activity intolerance will decrease Outcome: Progressing   Problem: Coping: Goal: Level of anxiety will decrease Outcome: Progressing   Problem: Elimination: Goal: Will not experience complications related to bowel motility Outcome: Progressing Goal: Will not experience complications related to urinary retention Outcome: Progressing   Problem: Pain Managment: Goal: General experience of comfort will improve Outcome: Progressing   Problem: Safety: Goal: Ability to remain free  from injury will improve Outcome: Progressing   Problem: Skin Integrity: Goal: Risk for impaired skin integrity will decrease Outcome: Progressing

## 2022-06-07 NOTE — NC FL2 (Signed)
[place] LEVEL OF CARE SCREENING TOOL     IDENTIFICATION  Patient Name: Carlos Ballard Birthdate: [birth year] Sex: male Admission Date (Current Location): 06/04/2022  [facility] and [place] Number:  [employer] and Address:  [place] [address], [place]      Provider Number: [id number]  Attending Physician Name and Address:  Deatra James, MD  Relative Name and Phone Number:       Current Level of Care: Hospital Recommended Level of Care: [place] Prior Approval Number:    Date Approved/Denied:   PASRR Number: [id number]  Discharge Plan: SNF    Current Diagnoses: Patient Active Problem List   Diagnosis Date Noted   Hematuria 06/05/2022   Ambulatory dysfunction 06/04/2022   COVID-19 virus infection 06/04/2022   Weakness 06/04/2022   Right sided weakness 02/15/2017   TIA (transient ischemic attack) 02/15/2017   GERD (gastroesophageal reflux disease) 02/15/2017   Essential hypertension 02/15/2017   Right leg weakness    Atrial fibrillation ([place]) 12/16/2013   Stroke ([place]) 12/16/2013   Dementia without behavioral disturbance ([place]) 12/16/2013   NPH (normal pressure hydrocephalus) ([place]) 03/27/2013   Seizure disorder ([place]) 03/27/2013    Orientation RESPIRATION BLADDER Height & Weight     Self  Normal Incontinent Weight: 190 lb 11.2 oz (86.5 kg) Height:  '5\' 8"'$  (172.7 cm)  BEHAVIORAL SYMPTOMS/MOOD NEUROLOGICAL BOWEL NUTRITION STATUS     (Seizure disorder controlled with medication) Incontinent Diet (see dc summary)  AMBULATORY STATUS COMMUNICATION OF NEEDS Skin   Extensive Assist Verbally Normal                       Personal Care Assistance Level of Assistance  Bathing, Feeding, Dressing Bathing Assistance: Maximum assistance Feeding assistance: Limited assistance Dressing Assistance: Maximum assistance     Functional Limitations Info  Sight, Hearing, Speech Sight Info:  Impaired   Speech Info: Adequate    SPECIAL CARE FACTORS FREQUENCY  PT (By licensed PT), OT (By licensed OT)     PT Frequency: 5x week OT Frequency: 3x week            Contractures Contractures Info: Not present    Additional Factors Info  Code Status, Allergies Code Status Info: DNR Allergies Info: Depakote, Phenytek           Current Medications (06/07/2022):  This is the current hospital active medication list Current Facility-Administered Medications  Medication Dose Route Frequency Provider Last Rate Last Admin   acetaminophen (TYLENOL) tablet 650 mg  650 mg Oral Q6H PRN Lady Deutscher, MD   650 mg at 06/06/22 7412   Or   acetaminophen (TYLENOL) suppository 650 mg  650 mg Rectal Q6H PRN Lady Deutscher, MD       albuterol (VENTOLIN HFA) 108 (90 Base) MCG/ACT inhaler 2 puff  2 puff Inhalation Q4H PRN Shahmehdi, Seyed A, MD       apixaban (ELIQUIS) tablet 2.5 mg  2.5 mg Oral BID Skipper Cliche A, MD       [START ON 06/10/2022] apixaban (ELIQUIS) tablet 5 mg  5 mg Oral BID Shahmehdi, Seyed A, MD       ascorbic acid (VITAMIN C) tablet 500 mg  500 mg Oral Daily Shahmehdi, Seyed A, MD   500 mg at 06/07/22 0948   chlorpheniramine-HYDROcodone (TUSSIONEX) 10-8 MG/5ML suspension 5 mL  5 mL Oral Q12H PRN Lady Deutscher, MD  cholecalciferol (VITAMIN D3) 25 MCG (1000 UNIT) tablet 2,000 Units  2,000 Units Oral Daily Lady Deutscher, MD   2,000 Units at 06/07/22 0948   [START ON 06/08/2022] citalopram (CELEXA) tablet 30 mg  30 mg Oral Daily Shahmehdi, Seyed A, MD       cyanocobalamin (VITAMIN B12) tablet 1,000 mcg  1,000 mcg Oral Daily Lady Deutscher, MD   1,000 mcg at 06/07/22 0949   finasteride (PROSCAR) tablet 5 mg  5 mg Oral QPM Lady Deutscher, MD   5 mg at 06/06/22 1721   guaiFENesin-dextromethorphan (ROBITUSSIN DM) 100-10 MG/5ML syrup 10 mL  10 mL Oral Q8H Shahmehdi, Seyed A, MD   10 mL at 06/07/22 0602   levalbuterol (XOPENEX) nebulizer solution 0.63  mg  0.63 mg Nebulization Q6H PRN Shahmehdi, Erling Conte A, MD       levETIRAcetam (KEPPRA) tablet 1,500 mg  1,500 mg Oral BID Lady Deutscher, MD   1,500 mg at 06/07/22 8889   LORazepam (ATIVAN) injection 0.25 mg  0.25 mg Intravenous Q4H PRN Lady Deutscher, MD   0.25 mg at 06/06/22 1335   melatonin tablet 9 mg  9 mg Oral QHS Lady Deutscher, MD   9 mg at 06/06/22 2007   nirmatrelvir/ritonavir EUA (renal dosing) (PAXLOVID) 2 tablet  2 tablet Oral BID Skipper Cliche A, MD   2 tablet at 06/07/22 0949   ondansetron (ZOFRAN) tablet 4 mg  4 mg Oral Q6H PRN Lady Deutscher, MD       Or   ondansetron [facility]) injection 4 mg  4 mg Intravenous Q6H PRN Lady Deutscher, MD       oxyCODONE (Oxy IR/ROXICODONE) immediate release tablet 5 mg  5 mg Oral Q4H PRN Lady Deutscher, MD       pantoprazole (PROTONIX) EC tablet 40 mg  40 mg Oral Q1200 Lady Deutscher, MD   40 mg at 06/07/22 1256   polyethylene glycol (MIRALAX / GLYCOLAX) packet 17 g  17 g Oral Daily PRN Lady Deutscher, MD       QUEtiapine (SEROQUEL) tablet 25 mg  25 mg Oral QHS Shahmehdi, Seyed A, MD       zinc sulfate capsule 220 mg  220 mg Oral Daily Shahmehdi, Seyed A, MD   220 mg at 06/07/22 1694     Discharge Medications: Please see discharge summary for a list of discharge medications.  Relevant Imaging Results:  Relevant Lab Results:   Additional Information SSN: [id number] [place]

## 2022-06-08 DIAGNOSIS — Z515 Encounter for palliative care: Secondary | ICD-10-CM

## 2022-06-08 DIAGNOSIS — F0390 Unspecified dementia without behavioral disturbance: Secondary | ICD-10-CM | POA: Diagnosis not present

## 2022-06-08 DIAGNOSIS — U071 COVID-19: Principal | ICD-10-CM

## 2022-06-08 DIAGNOSIS — R262 Difficulty in walking, not elsewhere classified: Secondary | ICD-10-CM | POA: Diagnosis not present

## 2022-06-08 DIAGNOSIS — Z7189 Other specified counseling: Secondary | ICD-10-CM

## 2022-06-08 DIAGNOSIS — I4891 Unspecified atrial fibrillation: Secondary | ICD-10-CM | POA: Diagnosis not present

## 2022-06-08 LAB — CBC WITH DIFFERENTIAL/PLATELET
Abs Immature Granulocytes: 0.01 10*3/uL (ref 0.00–0.07)
Basophils Absolute: 0 10*3/uL (ref 0.0–0.1)
Basophils Relative: 1 %
Eosinophils Absolute: 0.1 10*3/uL (ref 0.0–0.5)
Eosinophils Relative: 4 %
HCT: 38.2 % — ABNORMAL LOW (ref 39.0–52.0)
Hemoglobin: 13.3 g/dL (ref 13.0–17.0)
Immature Granulocytes: 0 %
Lymphocytes Relative: 26 %
Lymphs Abs: 0.9 10*3/uL (ref 0.7–4.0)
MCH: 33.2 pg (ref 26.0–34.0)
MCHC: 34.8 g/dL (ref 30.0–36.0)
MCV: 95.3 fL (ref 80.0–100.0)
Monocytes Absolute: 0.6 10*3/uL (ref 0.1–1.0)
Monocytes Relative: 16 %
Neutro Abs: 1.9 10*3/uL (ref 1.7–7.7)
Neutrophils Relative %: 53 %
Platelets: 170 10*3/uL (ref 150–400)
RBC: 4.01 MIL/uL — ABNORMAL LOW (ref 4.22–5.81)
RDW: 13 % (ref 11.5–15.5)
WBC: 3.6 10*3/uL — ABNORMAL LOW (ref 4.0–10.5)
nRBC: 0 % (ref 0.0–0.2)

## 2022-06-08 LAB — COMPREHENSIVE METABOLIC PANEL
ALT: 16 U/L (ref 0–44)
AST: 17 U/L (ref 15–41)
Albumin: 2.9 g/dL — ABNORMAL LOW (ref 3.5–5.0)
Alkaline Phosphatase: 66 U/L (ref 38–126)
Anion gap: 3 — ABNORMAL LOW (ref 5–15)
BUN: 12 mg/dL (ref 8–23)
CO2: 25 mmol/L (ref 22–32)
Calcium: 8.5 mg/dL — ABNORMAL LOW (ref 8.9–10.3)
Chloride: 107 mmol/L (ref 98–111)
Creatinine, Ser: 1.05 mg/dL (ref 0.61–1.24)
GFR, Estimated: >60 mL/min (ref >60)
Glucose, Bld: 102 mg/dL — ABNORMAL HIGH (ref 70–99)
Potassium: 3.3 mmol/L — ABNORMAL LOW (ref 3.5–5.1)
Sodium: 135 mmol/L (ref 135–145)
Total Bilirubin: 0.7 mg/dL (ref 0.3–1.2)
Total Protein: 5.9 g/dL — ABNORMAL LOW (ref 6.5–8.1)

## 2022-06-08 LAB — D-DIMER, QUANTITATIVE: D-Dimer, Quant: 15.11 ug/mL-FEU — ABNORMAL HIGH (ref 0.00–0.50)

## 2022-06-08 LAB — C-REACTIVE PROTEIN: CRP: 2.1 mg/dL — ABNORMAL HIGH (ref <1.0)

## 2022-06-08 LAB — FERRITIN: Ferritin: 386 ng/mL — ABNORMAL HIGH (ref 24–336)

## 2022-06-08 LAB — MAGNESIUM: Magnesium: 2 mg/dL (ref 1.7–2.4)

## 2022-06-08 LAB — PHOSPHORUS: Phosphorus: 2.7 mg/dL (ref 2.5–4.6)

## 2022-06-08 MED ORDER — POTASSIUM CHLORIDE CRYS ER 20 MEQ PO TBCR
40.0000 meq | EXTENDED_RELEASE_TABLET | Freq: Once | ORAL | Status: AC
  Administered 2022-06-08: 40 meq via ORAL
  Filled 2022-06-08: qty 2

## 2022-06-08 NOTE — Progress Notes (Signed)
PROGRESS NOTE    Carlos Ballard  ZOX:[id number] DOB: [birth year] DOA: 06/04/2022 PCP: Glenda Chroman, MD    Chief Complaint  Patient presents with   Weakness    Brief Narrative:   Carlos Ballard is a 88 y.o. male with medical history significant of atrial fibrillation on Eliquis, stroke, normal pressure hydrocephalus with shunt in place, dementia with occasional agitation, gastroesophageal reflux disease, and hypertension as well as a seizure disorder who presents for evaluation of multiple falls and increased weakness which started over the past 2 days.   Patient lives with his daughter and she states she has not seen his falls but he was on the floor beside his bed this morning when she first saw him.  He had another fall where he simply slid off the end of his bed that he was sitting on today.  He also fell in the bathroom striking the left side of his head but she did not witness that full and heard him and went to him immediately.  There was no loss of consciousness and he does not complain of pain.    Assessment & Plan:   Principal Problem:   Ambulatory dysfunction Active Problems:   COVID-19 virus infection   Atrial fibrillation (HCC)   NPH (normal pressure hydrocephalus) (HCC)   Seizure disorder (HCC)   Dementia without behavioral disturbance (HCC)   Weakness   Hematuria   Multiple falls at home. Probably secondary to generalized weakness and COVID 19 infection.  Therapy eval recommending SNF.  Daughter is hopeful to take him home.   Covid 19 infection: Paxlovid was started, but daughter requested the medication to be discontinued.     Atrial fibrillation:  Rate controlled.  On anti coagulation / eliquis.     Seizure Disorder:  Stable.  Continue with Keppra.   Hematuria:  Exacerbated by in and out cath.  Eliquis was temporarily held.  Hematuria resolved.    Dementia without behavioral disturbance.  - sundowning with confusion and agitation.  -  on seroquel and ativan.  - pt received ativan overnight.   Due to patient's advanced age and multiple co morbidities, palliative care consulted and recommendations given.    Hypokalemia  Replaced.        DVT prophylaxis: Eliquis Code Status: DNR.  Family Communication: none  Disposition:   Status is: Inpatient Remains inpatient appropriate because: possible d/c in the next 24 hours.    Level of care: Med-Surg Consultants:  None.   Procedures: none.   Antimicrobials: none.   Subjective: No new complaints.   Objective: Vitals:   06/06/22 2003 06/07/22 0500 06/07/22 0601 06/07/22 1249  BP: (!) 149/96  (!) 157/95 (!) 133/94  Pulse: 63  63 93  Resp: 18  20   Temp: (!) 97.4 F (36.3 C)     TempSrc:      SpO2: 97%  98% 99%  Weight:  86.5 kg    Height:        Intake/Output Summary (Last 24 hours) at 06/08/2022 1415 Last data filed at 06/08/2022 1300 Gross per 24 hour  Intake 720 ml  Output 1300 ml  Net -580 ml   Filed Weights   06/04/22 1316 06/05/22 0500 06/07/22 0500  Weight: 90.7 kg 83.3 kg 86.5 kg    Examination:  General exam: Appears calm and comfortable  Respiratory system: Clear to auscultation. Respiratory effort normal. Cardiovascular system: S1 & S2 heard, RRR. No JVD,  No pedal edema. Gastrointestinal system: Abdomen is  nondistended, soft and nontender. Normal bowel sounds heard. Central nervous system: lethargic from ativan overnight.  Extremities: no pedal edema.  Skin: No rashes,  Psychiatry: unable to assess.    Data Reviewed: I have personally reviewed following labs and imaging studies  CBC: Recent Labs  Lab 06/04/22 1343 06/05/22 0358 06/06/22 0450 06/07/22 0341 06/08/22 0456  WBC 7.3 6.5 4.8 3.1* 3.6*  NEUTROABS 5.9 4.7 2.5 1.3* 1.9  HGB 15.1 13.7 13.8 14.2 13.3  HCT 45.8 41.8 42.3 42.6 38.2*  MCV 99.1 99.5 99.5 97.7 95.3  PLT 160 138* 144* 136* 330    Basic Metabolic Panel: Recent Labs  Lab 06/04/22 1332  06/05/22 0358 06/06/22 0450 06/07/22 0341 06/08/22 0456  NA 135 134* 137 136 135  K 3.8 5.3* 3.9 3.5 3.3*  CL 102 103 105 106 107  CO2 '24 23 25 25 25  '$ GLUCOSE 108* 86 92 76 102*  BUN '14 11 11 12 12  '$ CREATININE 1.28* 0.97 1.07 1.04 1.05  CALCIUM 9.1 8.5* 8.6* 8.5* 8.5*  MG  --  2.1 2.1 2.0 2.0  PHOS  --  2.6 2.9 2.6 2.7    GFR: Estimated Creatinine Clearance: 48 mL/min (by C-G formula based on SCr of 1.05 mg/dL).  Liver Function Tests: Recent Labs  Lab 06/05/22 0358 06/06/22 0450 06/07/22 0341 06/08/22 0456  AST '31 18 17 17  '$ ALT '19 15 15 16  '$ ALKPHOS 66 63 62 66  BILITOT 2.5* 0.9 0.9 0.7  PROT 6.5 6.3* 6.1* 5.9*  ALBUMIN 3.3* 3.1* 2.9* 2.9*    CBG: Recent Labs  Lab 06/04/22 1325  GLUCAP 127*     Recent Results (from the past 240 hour(s))  SARS Coronavirus 2 by RT PCR (hospital order, performed in [facility] hospital lab) *cepheid single result test* Anterior Nasal Swab     Status: Abnormal   Collection Time: 06/04/22  3:40 PM   Specimen: Anterior Nasal Swab  Result Value Ref Range Status   SARS Coronavirus 2 by RT PCR POSITIVE (A) NEGATIVE Final    Comment: (NOTE) SARS-CoV-2 target nucleic acids are DETECTED  SARS-CoV-2 RNA is generally detectable in upper respiratory specimens  during the acute phase of infection.  Positive results are indicative  of the presence of the identified virus, but do not rule out bacterial infection or co-infection with other pathogens not detected by the test.  Clinical correlation with patient history and  other diagnostic information is necessary to determine patient infection status.  The expected result is negative.  Fact Sheet for Patients:   https://www.patel.info/   Fact Sheet for Healthcare Providers:   https://hall.com/    This test is not yet approved or cleared by the [place] FDA and  has been authorized for detection and/or diagnosis of SARS-CoV-2 by FDA under  an Emergency Use Authorization (EUA).  This EUA will remain in effect (meaning this test can be used) for the duration of  the COVID-19 declaration under Section 564(b)(1)  of the Act, 21 U.S.C. section 360-bbb-3(b)(1), unless the authorization is terminated or revoked sooner.   Performed at [facility], [address] [place], [place] [address]   Urine Culture     Status: Abnormal   Collection Time: 06/04/22  5:00 PM   Specimen: Urine, Clean Catch  Result Value Ref Range Status   Specimen Description   Final    URINE, CLEAN CATCH Performed at [facility], [address], [place], [place] [address]    Special Requests  Final    NONE Performed at [facility], [address] [place], [place] [address]    Culture MULTIPLE SPECIES PRESENT, SUGGEST RECOLLECTION (A)  Final   Report Status 06/07/2022 FINAL  Final         Radiology Studies: No results found.      Scheduled Meds:  apixaban  2.5 mg Oral BID   [START ON 06/10/2022] apixaban  5 mg Oral BID   ascorbic acid  500 mg Oral Daily   cholecalciferol  2,000 Units Oral Daily   citalopram  30 mg Oral Daily   cyanocobalamin  1,000 mcg Oral Daily   finasteride  5 mg Oral QPM   guaiFENesin-dextromethorphan  10 mL Oral Q8H   levETIRAcetam  1,500 mg Oral BID   melatonin  9 mg Oral QHS   nirmatrelvir/ritonavir EUA (renal dosing)  2 tablet Oral BID   pantoprazole  40 mg Oral Q1200   QUEtiapine  25 mg Oral QHS   zinc sulfate  220 mg Oral Daily   Continuous Infusions:   LOS: 4 days    Time spent: 40 minutes.     Hosie Poisson, MD [facility]   To contact the attending provider between 7A-7P or the covering provider during after hours 7P-7A, please log into the web site [email or web address] and access using universal [place] password for that web site. If you do not have the password, please call the hospital operator.  06/08/2022, 2:15 PM

## 2022-06-08 NOTE — Progress Notes (Signed)
Palliative: Chart review completed.  At this point [name] goals are set for continuing to treat the treatable but no CPR or intubation.  Time for outcomes.  His daughter has been his main live-in caregiver for the last 10+ years.  Currently being considered for short-term rehab.  10 days quarantine after COVID diagnosis on 8/19.  Conference with attending, bedside nursing staff, transition of care team related to patient condition, needs, goals of care, disposition.  Plan:   Continue to treat the treatable but no CPR or intubation.  COVID quarantine until 8/29-30.  Short-term rehab if still needed at that time. Addendum: Carlos Ballard is now 5 days, dx 8/19.   No charge Carlos Axe, NP Palliative medicine team Team phone [phone] Greater than 50% of this time was spent counseling and coordinating care related to the above assessment and plan.

## 2022-06-09 DIAGNOSIS — F0390 Unspecified dementia without behavioral disturbance: Secondary | ICD-10-CM | POA: Diagnosis not present

## 2022-06-09 DIAGNOSIS — I4891 Unspecified atrial fibrillation: Secondary | ICD-10-CM | POA: Diagnosis not present

## 2022-06-09 DIAGNOSIS — U071 COVID-19: Secondary | ICD-10-CM | POA: Diagnosis not present

## 2022-06-09 DIAGNOSIS — R262 Difficulty in walking, not elsewhere classified: Secondary | ICD-10-CM | POA: Diagnosis not present

## 2022-06-09 LAB — CBC WITH DIFFERENTIAL/PLATELET
Abs Immature Granulocytes: 0.02 10*3/uL (ref 0.00–0.07)
Basophils Absolute: 0 10*3/uL (ref 0.0–0.1)
Basophils Relative: 1 %
Eosinophils Absolute: 0.2 10*3/uL (ref 0.0–0.5)
Eosinophils Relative: 4 %
HCT: 41 % (ref 39.0–52.0)
Hemoglobin: 13.7 g/dL (ref 13.0–17.0)
Immature Granulocytes: 1 %
Lymphocytes Relative: 28 %
Lymphs Abs: 1.2 10*3/uL (ref 0.7–4.0)
MCH: 32.7 pg (ref 26.0–34.0)
MCHC: 33.4 g/dL (ref 30.0–36.0)
MCV: 97.9 fL (ref 80.0–100.0)
Monocytes Absolute: 0.6 10*3/uL (ref 0.1–1.0)
Monocytes Relative: 13 %
Neutro Abs: 2.4 10*3/uL (ref 1.7–7.7)
Neutrophils Relative %: 53 %
Platelets: 148 10*3/uL — ABNORMAL LOW (ref 150–400)
RBC: 4.19 MIL/uL — ABNORMAL LOW (ref 4.22–5.81)
RDW: 12.9 % (ref 11.5–15.5)
WBC: 4.4 10*3/uL (ref 4.0–10.5)
nRBC: 0 % (ref 0.0–0.2)

## 2022-06-09 LAB — COMPREHENSIVE METABOLIC PANEL
ALT: 16 U/L (ref 0–44)
AST: 16 U/L (ref 15–41)
Albumin: 3 g/dL — ABNORMAL LOW (ref 3.5–5.0)
Alkaline Phosphatase: 66 U/L (ref 38–126)
Anion gap: 4 — ABNORMAL LOW (ref 5–15)
BUN: 14 mg/dL (ref 8–23)
CO2: 24 mmol/L (ref 22–32)
Calcium: 8.6 mg/dL — ABNORMAL LOW (ref 8.9–10.3)
Chloride: 109 mmol/L (ref 98–111)
Creatinine, Ser: 1.09 mg/dL (ref 0.61–1.24)
GFR, Estimated: >60 mL/min (ref >60)
Glucose, Bld: 94 mg/dL (ref 70–99)
Potassium: 3.9 mmol/L (ref 3.5–5.1)
Sodium: 137 mmol/L (ref 135–145)
Total Bilirubin: 0.5 mg/dL (ref 0.3–1.2)
Total Protein: 6.1 g/dL — ABNORMAL LOW (ref 6.5–8.1)

## 2022-06-09 LAB — C-REACTIVE PROTEIN: CRP: 1.5 mg/dL — ABNORMAL HIGH (ref <1.0)

## 2022-06-09 LAB — MAGNESIUM: Magnesium: 2.1 mg/dL (ref 1.7–2.4)

## 2022-06-09 LAB — FERRITIN: Ferritin: 379 ng/mL — ABNORMAL HIGH (ref 24–336)

## 2022-06-09 LAB — PHOSPHORUS: Phosphorus: 3.1 mg/dL (ref 2.5–4.6)

## 2022-06-09 LAB — D-DIMER, QUANTITATIVE: D-Dimer, Quant: 11.93 ug/mL-FEU — ABNORMAL HIGH (ref 0.00–0.50)

## 2022-06-09 NOTE — Progress Notes (Signed)
PROGRESS NOTE    Carlos Ballard  PRF:[id number] DOB: 10/01/1928 DOA: 06/04/2022 PCP: Glenda Chroman, MD    Chief Complaint  Patient presents with   Weakness    Brief Narrative:   Carlos Ballard is a 88 y.o. male with medical history significant of atrial fibrillation on Eliquis, stroke, normal pressure hydrocephalus with shunt in place, dementia with occasional agitation, gastroesophageal reflux disease, and hypertension as well as a seizure disorder who presents for evaluation of multiple falls and increased weakness which started over the past 2 days.   Patient lives with his daughter and she states she has not seen his falls but he was on the floor beside his bed this morning when she first saw him.  He had another fall where he simply slid off the end of his bed that he was sitting on today.  He also fell in the bathroom striking the left side of his head but she did not witness that full and heard him and went to him immediately.  There was no loss of consciousness and he does not complain of pain.   Pt is currently waiting his isolation for discharge to SNF.   Assessment & Plan:   Principal Problem:   Ambulatory dysfunction Active Problems:   COVID-19 virus infection   Atrial fibrillation (HCC)   NPH (normal pressure hydrocephalus) (HCC)   Seizure disorder (HCC)   Dementia without behavioral disturbance (HCC)   Weakness   Hematuria   Goals of care, counseling/discussion   Palliative care by specialist   Multiple falls at home. Probably secondary to generalized weakness and COVID 19 infection.  Therapy eval recommending SNF.  Daughter is hopeful to take him home If he improves by Monday and is able to walk. Daughter would like a list of caregivers to help him at home when he is ready for discharge.   Covid 19 infection: Paxlovid was started, but daughter requested the medication to be discontinued.     Atrial fibrillation:  Rate controlled.  On anti  coagulation / eliquis.     Seizure Disorder:  Stable.  Continue with Keppra.   Hematuria:  Exacerbated by in and out cath.  Eliquis was temporarily held.  Hematuria resolved.    Dementia without behavioral disturbance.  - sundowning with confusion and agitation.  - on seroquel and ativan.  - he is more alert today and comfortable.    Constipation:  Daughter would like [place] to wait before giving stool softeners.   Due to patient's advanced age and multiple co morbidities, palliative care consulted and recommendations given.    Hypokalemia  Replaced. Repeat levels wnl.        DVT prophylaxis: Eliquis Code Status: DNR.  Family Communication: none  Disposition:   Status is: Inpatient Remains inpatient appropriate because: possible d/c in the next 24 hours.    Level of care: Med-Surg Consultants:  None.   Procedures: none.   Antimicrobials: none.   Subjective: No events overnight.   Objective: Vitals:   06/07/22 0601 06/07/22 1249 06/08/22 2154 06/09/22 0721  BP: (!) 157/95 (!) 133/94 139/82 136/85  Pulse: 63 93 (!) 59 60  Resp: 20  19   Temp:   (!) 95.5 F (35.3 C) 97.6 F (36.4 C)  TempSrc:      SpO2: 98% 99% 96% 94%  Weight:    86.1 kg  Height:        Intake/Output Summary (Last 24 hours) at 06/09/2022 1408 Last data filed at  06/09/2022 0700 Gross per 24 hour  Intake --  Output 100 ml  Net -100 ml    Filed Weights   06/05/22 0500 06/07/22 0500 06/09/22 0721  Weight: 83.3 kg 86.5 kg 86.1 kg    Examination:  General exam: Appears calm and comfortable  Respiratory system: Clear to auscultation. Respiratory effort normal. Cardiovascular system: S1 & S2 heard, RRR. No JVD, No pedal edema. Gastrointestinal system: Abdomen is nondistended, soft and nontender. Normal bowel sounds heard. Central nervous system: Arousable, not in distress.  Extremities: Symmetric 5 x 5 power. Skin: No rashes, lesions or ulcers Psychiatry: unable to assess.        Data Reviewed: I have personally reviewed following labs and imaging studies  CBC: Recent Labs  Lab 06/05/22 0358 06/06/22 0450 06/07/22 0341 06/08/22 0456 06/09/22 0438  WBC 6.5 4.8 3.1* 3.6* 4.4  NEUTROABS 4.7 2.5 1.3* 1.9 2.4  HGB 13.7 13.8 14.2 13.3 13.7  HCT 41.8 42.3 42.6 38.2* 41.0  MCV 99.5 99.5 97.7 95.3 97.9  PLT 138* 144* 136* 170 148*     Basic Metabolic Panel: Recent Labs  Lab 06/05/22 0358 06/06/22 0450 06/07/22 0341 06/08/22 0456 06/09/22 0438  NA 134* 137 136 135 137  K 5.3* 3.9 3.5 3.3* 3.9  CL 103 105 106 107 109  CO2 '23 25 25 25 24  '$ GLUCOSE 86 92 76 102* 94  BUN '11 11 12 12 14  '$ CREATININE 0.97 1.07 1.04 1.05 1.09  CALCIUM 8.5* 8.6* 8.5* 8.5* 8.6*  MG 2.1 2.1 2.0 2.0 2.1  PHOS 2.6 2.9 2.6 2.7 3.1     GFR: Estimated Creatinine Clearance: 46.2 mL/min (by C-G formula based on SCr of 1.09 mg/dL).  Liver Function Tests: Recent Labs  Lab 06/05/22 0358 06/06/22 0450 06/07/22 0341 06/08/22 0456 06/09/22 0438  AST '31 18 17 17 16  '$ ALT '19 15 15 16 16  '$ ALKPHOS 66 63 62 66 66  BILITOT 2.5* 0.9 0.9 0.7 0.5  PROT 6.5 6.3* 6.1* 5.9* 6.1*  ALBUMIN 3.3* 3.1* 2.9* 2.9* 3.0*     CBG: Recent Labs  Lab 06/04/22 1325  GLUCAP 127*      Recent Results (from the past 240 hour(s))  SARS Coronavirus 2 by RT PCR (hospital order, performed in [facility] hospital lab) *cepheid single result test* Anterior Nasal Swab     Status: Abnormal   Collection Time: 06/04/22  3:40 PM   Specimen: Anterior Nasal Swab  Result Value Ref Range Status   SARS Coronavirus 2 by RT PCR POSITIVE (A) NEGATIVE Final    Comment: (NOTE) SARS-CoV-2 target nucleic acids are DETECTED  SARS-CoV-2 RNA is generally detectable in upper respiratory specimens  during the acute phase of infection.  Positive results are indicative  of the presence of the identified virus, but do not rule out bacterial infection or co-infection with other pathogens not detected by the test.   Clinical correlation with patient history and  other diagnostic information is necessary to determine patient infection status.  The expected result is negative.  Fact Sheet for Patients:   https://www.patel.info/   Fact Sheet for Healthcare Providers:   https://hall.com/    This test is not yet approved or cleared by the [place] FDA and  has been authorized for detection and/or diagnosis of SARS-CoV-2 by FDA under an Emergency Use Authorization (EUA).  This EUA will remain in effect (meaning this test can be used) for the duration of  the COVID-19 declaration under Section 564(b)(1)  of  the Act, 21 U.S.C. section 360-bbb-3(b)(1), unless the authorization is terminated or revoked sooner.   Performed at [facility], [address] [place], [place] [address]   Urine Culture     Status: Abnormal   Collection Time: 06/04/22  5:00 PM   Specimen: Urine, Clean Catch  Result Value Ref Range Status   Specimen Description   Final    URINE, CLEAN CATCH Performed at [facility], [address], [place], [place] [address]    Special Requests   Final    NONE Performed at [facility], [address] [place], [place] [address]    Culture MULTIPLE SPECIES PRESENT, SUGGEST RECOLLECTION (A)  Final   Report Status 06/07/2022 FINAL  Final         Radiology Studies: No results found.      Scheduled Meds:  apixaban  2.5 mg Oral BID   [START ON 06/10/2022] apixaban  5 mg Oral BID   ascorbic acid  500 mg Oral Daily   cholecalciferol  2,000 Units Oral Daily   citalopram  30 mg Oral Daily   cyanocobalamin  1,000 mcg Oral Daily   finasteride  5 mg Oral QPM   guaiFENesin-dextromethorphan  10 mL Oral Q8H   levETIRAcetam  1,500 mg Oral BID   melatonin  9 mg Oral QHS   nirmatrelvir/ritonavir EUA (renal dosing)  2 tablet Oral BID   pantoprazole  40 mg Oral Q1200   QUEtiapine  25 mg Oral QHS   zinc sulfate  220 mg Oral Daily   Continuous  Infusions:   LOS: 5 days    Time spent: 40 minutes.     Hosie Poisson, MD [facility]   To contact the attending provider between 7A-7P or the covering provider during after hours 7P-7A, please log into the web site [email or web address] and access using universal [place] password for that web site. If you do not have the password, please call the hospital operator.  06/09/2022, 2:08 PM

## 2022-06-09 NOTE — Progress Notes (Signed)
Patient has moments of disorientation, and becomes combative. This nurse was able to clam patient down. Patient's Daughter at bedside. Patient only took Keppra, Seroquel, and Eliquis. Patients Daughter stated they decline Robitussin, Melitonin and Paxlovid. Patient slept the rest of the night with out complaints of pain.

## 2022-06-10 DIAGNOSIS — U071 COVID-19: Secondary | ICD-10-CM | POA: Diagnosis not present

## 2022-06-10 DIAGNOSIS — F0390 Unspecified dementia without behavioral disturbance: Secondary | ICD-10-CM | POA: Diagnosis not present

## 2022-06-10 DIAGNOSIS — R262 Difficulty in walking, not elsewhere classified: Secondary | ICD-10-CM | POA: Diagnosis not present

## 2022-06-10 NOTE — TOC Progression Note (Signed)
Transition of Care [facility]) - Progression Note    Patient Details  Name: Little Winton MRN: [id number] Date of Birth: 1928-07-17  [facility] CM/SW Contact  Boneta Lucks, RN Phone Number: 06/10/2022, 11:41 AM  Clinical Narrative:   Donella Stade contacted [employer] for private pay sitter at daughters request. Daughter is aware they will call and explain cost.    Expected Discharge Plan: Skilled Nursing Facility Barriers to Discharge: Continued Medical Work up  Expected Discharge Plan and Services Expected Discharge Plan: [place] In-house Referral: Clinical Social Work   Post Acute Care Choice: [place] Living arrangements for the past 2 months: [place]

## 2022-06-10 NOTE — Care Management Important Message (Signed)
Important Message  Patient Details  Name: Carlos Ballard MRN: [id number] Date of Birth: [birth year]   Medicare Important Message Given:  Other (see comment) (spoke with daughter [name] at [phone] to review letter, no additional copy needed)     Tommy Medal 06/10/2022, 10:15 AM

## 2022-06-10 NOTE — Progress Notes (Signed)
PT Cancellation Note  Patient Details Name: Dillyn Menna MRN: [id number] DOB: Aug 17, 1928   Cancelled Treatment:    Reason Eval/Treat Not Completed: Patient declined, no reason specified.  Patient agitated and refused treatment after much encouragement by family and Probation officer.  1:42 PM, 06/10/22 Lonell Grandchild, MPT Physical Therapist with [facility] [phone] office [phone] mobile phone

## 2022-06-10 NOTE — Progress Notes (Signed)
PROGRESS NOTE    Carlos Ballard  KGY:[id number] DOB: Apr 17, 1928 DOA: 06/04/2022 PCP: Glenda Chroman, MD    Chief Complaint  Patient presents with   Weakness    Brief Narrative:   Carlos Ballard is a 88 y.o. male with medical history significant of atrial fibrillation on Eliquis, stroke, normal pressure hydrocephalus with shunt in place, dementia with occasional agitation, gastroesophageal reflux disease, and hypertension as well as a seizure disorder who presents for evaluation of multiple falls and increased weakness which started over the past 2 days.   Patient lives with his daughter and she states she has not seen his falls but he was on the floor beside his bed this morning when she first saw him.  He had another fall where he simply slid off the end of his bed that he was sitting on today.  He also fell in the bathroom striking the left side of his head but she did not witness that full and heard him and went to him immediately.  There was no loss of consciousness and he does not complain of pain.   Pt is currently waiting his isolation for discharge to SNF.   Assessment & Plan:   Principal Problem:   Ambulatory dysfunction Active Problems:   COVID-19 virus infection   Atrial fibrillation (HCC)   NPH (normal pressure hydrocephalus) (HCC)   Seizure disorder (HCC)   Dementia without behavioral disturbance (HCC)   Weakness   Hematuria   Goals of care, counseling/discussion   Palliative care by specialist   Multiple falls at home. Probably secondary to generalized weakness and COVID 19 infection.  Therapy eval recommending SNF.  Daughter is hopeful to take him home If he improves by Monday and is able to walk. Daughter would like a list of caregivers to help him at home when he is ready for discharge.  Repeat PT eval ordered.   Covid 19 infection: Paxlovid was started, but daughter requested the medication to be discontinued.  No SOB, not on oxygen, he appears  comfortable and appears to be at baseline today.    Atrial fibrillation:  Rate controlled.  On anti coagulation / eliquis.     Seizure Disorder:  Stable.  Continue with Keppra.  No seizure episodes.   Hematuria:  Exacerbated by in and out cath.  Eliquis was temporarily held.  Hematuria resolved.    Dementia without behavioral disturbance.  - sundowning with confusion and agitation.  - on seroquel and ativan.  - he is more alert today and comfortable.    Constipation:  Daughter would like [place] to wait before giving stool softeners.  Small BM today.   Due to patient's advanced age and multiple co morbidities, palliative care consulted and recommendations given.    Hypokalemia  Replaced. Repeat levels wnl.        DVT prophylaxis: Eliquis Code Status: DNR.  Family Communication: none  Disposition:   Status is: Inpatient Remains inpatient appropriate because: possible d/c in the next 24 hours.    Level of care: Med-Surg Consultants:  None.   Procedures: none.   Antimicrobials: none.   Subjective: No events overnight.   Objective: Vitals:   06/09/22 0721 06/09/22 1430 06/09/22 2318 06/10/22 0631  BP: 136/85 (!) 147/77 (!) 142/89 (!) 137/104  Pulse: 60 65 72 90  Resp:  14 16   Temp: 97.6 F (36.4 C) 98.2 F (36.8 C)  (!) 97.5 F (36.4 C)  TempSrc:  Axillary    SpO2: 94%  100% 99% 98%  Weight: 86.1 kg   83.9 kg  Height:    '5\' 8"'$  (1.727 m)    Intake/Output Summary (Last 24 hours) at 06/10/2022 1311 Last data filed at 06/10/2022 0900 Gross per 24 hour  Intake 240 ml  Output 1750 ml  Net -1510 ml    Filed Weights   06/07/22 0500 06/09/22 0721 06/10/22 0631  Weight: 86.5 kg 86.1 kg 83.9 kg    Examination:  General exam: Appears calm and comfortable  Respiratory system: Clear to auscultation. Respiratory effort normal. Cardiovascular system: S1 & S2 heard, RRR. No JVD,  No pedal edema. Gastrointestinal system: Abdomen is nondistended, soft  and nontender. Normal bowel sounds heard. Central nervous system:  has dementia, pleasantly confused, able to move all extremities.  Extremities: Symmetric 5 x 5 power. Skin: No rashes, lesions or ulcers Psychiatry; unable to assess.       Data Reviewed: I have personally reviewed following labs and imaging studies  CBC: Recent Labs  Lab 06/05/22 0358 06/06/22 0450 06/07/22 0341 06/08/22 0456 06/09/22 0438  WBC 6.5 4.8 3.1* 3.6* 4.4  NEUTROABS 4.7 2.5 1.3* 1.9 2.4  HGB 13.7 13.8 14.2 13.3 13.7  HCT 41.8 42.3 42.6 38.2* 41.0  MCV 99.5 99.5 97.7 95.3 97.9  PLT 138* 144* 136* 170 148*     Basic Metabolic Panel: Recent Labs  Lab 06/05/22 0358 06/06/22 0450 06/07/22 0341 06/08/22 0456 06/09/22 0438  NA 134* 137 136 135 137  K 5.3* 3.9 3.5 3.3* 3.9  CL 103 105 106 107 109  CO2 '23 25 25 25 24  '$ GLUCOSE 86 92 76 102* 94  BUN '11 11 12 12 14  '$ CREATININE 0.97 1.07 1.04 1.05 1.09  CALCIUM 8.5* 8.6* 8.5* 8.5* 8.6*  MG 2.1 2.1 2.0 2.0 2.1  PHOS 2.6 2.9 2.6 2.7 3.1     GFR: Estimated Creatinine Clearance: 45.6 mL/min (by C-G formula based on SCr of 1.09 mg/dL).  Liver Function Tests: Recent Labs  Lab 06/05/22 0358 06/06/22 0450 06/07/22 0341 06/08/22 0456 06/09/22 0438  AST '31 18 17 17 16  '$ ALT '19 15 15 16 16  '$ ALKPHOS 66 63 62 66 66  BILITOT 2.5* 0.9 0.9 0.7 0.5  PROT 6.5 6.3* 6.1* 5.9* 6.1*  ALBUMIN 3.3* 3.1* 2.9* 2.9* 3.0*     CBG: Recent Labs  Lab 06/04/22 1325  GLUCAP 127*      Recent Results (from the past 240 hour(s))  SARS Coronavirus 2 by RT PCR (hospital order, performed in [facility] hospital lab) *cepheid single result test* Anterior Nasal Swab     Status: Abnormal   Collection Time: 06/04/22  3:40 PM   Specimen: Anterior Nasal Swab  Result Value Ref Range Status   SARS Coronavirus 2 by RT PCR POSITIVE (A) NEGATIVE Final    Comment: (NOTE) SARS-CoV-2 target nucleic acids are DETECTED  SARS-CoV-2 RNA is generally detectable in upper  respiratory specimens  during the acute phase of infection.  Positive results are indicative  of the presence of the identified virus, but do not rule out bacterial infection or co-infection with other pathogens not detected by the test.  Clinical correlation with patient history and  other diagnostic information is necessary to determine patient infection status.  The expected result is negative.  Fact Sheet for Patients:   https://www.patel.info/   Fact Sheet for Healthcare Providers:   https://hall.com/    This test is not yet approved or cleared by the [place] and  has been authorized for detection and/or diagnosis of SARS-CoV-2 by FDA under an Emergency Use Authorization (EUA).  This EUA will remain in effect (meaning this test can be used) for the duration of  the COVID-19 declaration under Section 564(b)(1)  of the Act, 21 U.S.C. section 360-bbb-3(b)(1), unless the authorization is terminated or revoked sooner.   Performed at [facility], [address] [place], [place] [address]   Urine Culture     Status: Abnormal   Collection Time: 06/04/22  5:00 PM   Specimen: Urine, Clean Catch  Result Value Ref Range Status   Specimen Description   Final    URINE, CLEAN CATCH Performed at [facility], [address] [place], [place] [address]    Special Requests   Final    NONE Performed at [facility], [address] [place], [place] [address]    Culture MULTIPLE SPECIES PRESENT, SUGGEST RECOLLECTION (A)  Final   Report Status 06/07/2022 FINAL  Final         Radiology Studies: No results found.      Scheduled Meds:  apixaban  5 mg Oral BID   ascorbic acid  500 mg Oral Daily   cholecalciferol  2,000 Units Oral Daily   citalopram  30 mg Oral Daily   cyanocobalamin  1,000 mcg Oral Daily   finasteride  5 mg Oral QPM   guaiFENesin-dextromethorphan  10 mL Oral Q8H   levETIRAcetam  1,500 mg Oral BID   melatonin   9 mg Oral QHS   pantoprazole  40 mg Oral Q1200   QUEtiapine  25 mg Oral QHS   zinc sulfate  220 mg Oral Daily   Continuous Infusions:   LOS: 6 days    Time spent: 30 minutes.     Hosie Poisson, MD [facility]   To contact the attending provider between 7A-7P or the covering provider during after hours 7P-7A, please log into the web site [email or web address] and access using universal [place] password for that web site. If you do not have the password, please call the hospital operator.  06/10/2022, 1:11 PM

## 2022-06-11 DIAGNOSIS — U071 COVID-19: Secondary | ICD-10-CM | POA: Diagnosis not present

## 2022-06-11 DIAGNOSIS — R262 Difficulty in walking, not elsewhere classified: Secondary | ICD-10-CM | POA: Diagnosis not present

## 2022-06-11 DIAGNOSIS — F0390 Unspecified dementia without behavioral disturbance: Secondary | ICD-10-CM | POA: Diagnosis not present

## 2022-06-11 DIAGNOSIS — I4891 Unspecified atrial fibrillation: Secondary | ICD-10-CM | POA: Diagnosis not present

## 2022-06-11 MED ORDER — LORAZEPAM 2 MG/ML IJ SOLN: 0.2500 mg | Freq: Every day | INTRAMUSCULAR | Status: DC | PRN

## 2022-06-11 MED ORDER — DILTIAZEM HCL ER COATED BEADS 120 MG PO CP24
240.0000 mg | ORAL_CAPSULE | Freq: Every day | ORAL | Status: DC
  Administered 2022-06-11 – 2022-06-12 (×2): 240 mg via ORAL
  Filled 2022-06-11 (×2): qty 2

## 2022-06-11 MED ORDER — SENNOSIDES-DOCUSATE SODIUM 8.6-50 MG PO TABS: 1.0000 | ORAL_TABLET | Freq: Every evening | ORAL | Status: DC | PRN

## 2022-06-11 MED ORDER — MELATONIN 3 MG PO TABS
3.0000 mg | ORAL_TABLET | Freq: Every day | ORAL | Status: DC | PRN
  Administered 2022-06-12 – 2022-06-15 (×4): 3 mg via ORAL
  Filled 2022-06-11 (×4): qty 1

## 2022-06-11 NOTE — Progress Notes (Addendum)
PROGRESS NOTE    Carlos Ballard  CBJ:[id number] DOB: [birth year] DOA: 06/04/2022 PCP: Glenda Chroman, MD    Chief Complaint  Patient presents with   Weakness    Brief Narrative:   Carlos Ballard is a 88 y.o. male with medical history significant of atrial fibrillation on Eliquis, stroke, normal pressure hydrocephalus with shunt in place, dementia with occasional agitation, gastroesophageal reflux disease, and hypertension as well as a seizure disorder who presents for evaluation of multiple falls and increased weakness which started over the past 2 days.   Patient lives with his daughter and she states she has not seen his falls but he was on the floor beside his bed this morning when she first saw him.  He had another fall where he simply slid off the end of his bed that he was sitting on today.  He also fell in the bathroom striking the left side of his head but she did not witness that full and heard him and went to him immediately.  There was no loss of consciousness and he does not complain of pain.   Pt is currently waiting his isolation for discharge to SNF.   Assessment & Plan:   Principal Problem:   Ambulatory dysfunction Active Problems:   COVID-19 virus infection   Atrial fibrillation (HCC)   NPH (normal pressure hydrocephalus) (HCC)   Seizure disorder (HCC)   Dementia without behavioral disturbance (HCC)   Weakness   Hematuria   Goals of care, counseling/discussion   Palliative care by specialist   Multiple falls at home. Probably secondary to generalized weakness and COVID 19 infection.  Therapy eval recommending SNF.  Daughter is hopeful to take him home If he improves by Monday and is able to walk. Daughter would like a list of caregivers to help him at home when he is ready for discharge.  Repeat PT eval ordered.   Covid 19 infection: Paxlovid was started, but daughter requested the medication to be discontinued.  No SOB, not on oxygen, he appears  comfortable and appears to be at baseline today.    Atrial fibrillation:  Rate controlled , restarted cardizem. 240 mg daily.  On anti coagulation / eliquis.     Seizure Disorder:  Stable.  Continue with Keppra.  No seizure episodes.   Hematuria:  Exacerbated by in and out cath.  Eliquis was temporarily held.  Hematuria resolved.    Dementia without behavioral disturbance.  - sundowning with confusion and agitation.  - on seroquel and ativan.  - he appears to be back to baseline.    Constipation:  Started the patient on miralax nd senna and colace.    Due to patient's advanced age and multiple co morbidities, palliative care consulted and recommendations given.    Hypokalemia  Replaced. Repeat levels wnl.    Hypertension:  BP parameters slight elevated this am.  Restarted his cardizem.  Continue to monitor.     DVT prophylaxis: Eliquis Code Status: DNR.  Family Communication: none  Disposition:   Status is: Inpatient Remains inpatient appropriate because: SNF   Level of care: Med-Surg Consultants:  None.   Procedures: none.   Antimicrobials: none.   Subjective: REFUSED PT YESTERDAY TWICE.   Objective: Vitals:   06/10/22 2146 06/10/22 2312 06/11/22 0637 06/11/22 1323  BP: (!) 147/91   (!) 140/92  Pulse: 68   63  Resp: 20   16  Temp:  98.6 F (37 C)    TempSrc:  Oral  SpO2: 99%   98%  Weight:   83.9 kg   Height:        Intake/Output Summary (Last 24 hours) at 06/11/2022 1540 Last data filed at 06/11/2022 1100 Gross per 24 hour  Intake 720 ml  Output --  Net 720 ml    Filed Weights   06/09/22 0721 06/10/22 0631 06/11/22 0637  Weight: 86.1 kg 83.9 kg 83.9 kg    Examination:  General exam: Appears calm and comfortable  Respiratory system: Clear to auscultation. Respiratory effort normal. Cardiovascular system: S1 & S2 heard, RRR. No JVD,  No pedal edema. Gastrointestinal system: Abdomen is nondistended, soft and nontender.Normal  bowel sounds heard. Central nervous system: Pleasantly confused.  Extremities: no pedal edema.  Skin: No rashes, lesions or ulcers Psychiatry: Mood & affect appropriate.      Data Reviewed: I have personally reviewed following labs and imaging studies  CBC: Recent Labs  Lab 06/05/22 0358 06/06/22 0450 06/07/22 0341 06/08/22 0456 06/09/22 0438  WBC 6.5 4.8 3.1* 3.6* 4.4  NEUTROABS 4.7 2.5 1.3* 1.9 2.4  HGB 13.7 13.8 14.2 13.3 13.7  HCT 41.8 42.3 42.6 38.2* 41.0  MCV 99.5 99.5 97.7 95.3 97.9  PLT 138* 144* 136* 170 148*     Basic Metabolic Panel: Recent Labs  Lab 06/05/22 0358 06/06/22 0450 06/07/22 0341 06/08/22 0456 06/09/22 0438  NA 134* 137 136 135 137  K 5.3* 3.9 3.5 3.3* 3.9  CL 103 105 106 107 109  CO2 '23 25 25 25 24  '$ GLUCOSE 86 92 76 102* 94  BUN '11 11 12 12 14  '$ CREATININE 0.97 1.07 1.04 1.05 1.09  CALCIUM 8.5* 8.6* 8.5* 8.5* 8.6*  MG 2.1 2.1 2.0 2.0 2.1  PHOS 2.6 2.9 2.6 2.7 3.1     GFR: Estimated Creatinine Clearance: 45.6 mL/min (by C-G formula based on SCr of 1.09 mg/dL).  Liver Function Tests: Recent Labs  Lab 06/05/22 0358 06/06/22 0450 06/07/22 0341 06/08/22 0456 06/09/22 0438  AST '31 18 17 17 16  '$ ALT '19 15 15 16 16  '$ ALKPHOS 66 63 62 66 66  BILITOT 2.5* 0.9 0.9 0.7 0.5  PROT 6.5 6.3* 6.1* 5.9* 6.1*  ALBUMIN 3.3* 3.1* 2.9* 2.9* 3.0*     CBG: No results for input(s): "GLUCAP" in the last 168 hours.    Recent Results (from the past 240 hour(s))  SARS Coronavirus 2 by RT PCR (hospital order, performed in [facility] hospital lab) *cepheid single result test* Anterior Nasal Swab     Status: Abnormal   Collection Time: 06/04/22  3:40 PM   Specimen: Anterior Nasal Swab  Result Value Ref Range Status   SARS Coronavirus 2 by RT PCR POSITIVE (A) NEGATIVE Final    Comment: (NOTE) SARS-CoV-2 target nucleic acids are DETECTED  SARS-CoV-2 RNA is generally detectable in upper respiratory specimens  during the acute phase of infection.   Positive results are indicative  of the presence of the identified virus, but do not rule out bacterial infection or co-infection with other pathogens not detected by the test.  Clinical correlation with patient history and  other diagnostic information is necessary to determine patient infection status.  The expected result is negative.  Fact Sheet for Patients:   https://www.patel.info/   Fact Sheet for Healthcare Providers:   https://hall.com/    This test is not yet approved or cleared by the [place] FDA and  has been authorized for detection and/or diagnosis of SARS-CoV-2 by FDA under an Emergency  Use Authorization (EUA).  This EUA will remain in effect (meaning this test can be used) for the duration of  the COVID-19 declaration under Section 564(b)(1)  of the Act, 21 U.S.C. section 360-bbb-3(b)(1), unless the authorization is terminated or revoked sooner.   Performed at [facility], [address], [place], [place] [address]   Urine Culture     Status: Abnormal   Collection Time: 06/04/22  5:00 PM   Specimen: Urine, Clean Catch  Result Value Ref Range Status   Specimen Description   Final    URINE, CLEAN CATCH Performed at [facility], [address], [place], [place] [address]    Special Requests   Final    NONE Performed at [facility], [address], [place], [place] [address]    Culture MULTIPLE SPECIES PRESENT, SUGGEST RECOLLECTION (A)  Final   Report Status 06/07/2022 FINAL  Final         Radiology Studies: No results found.      Scheduled Meds:  apixaban  5 mg Oral BID   ascorbic acid  500 mg Oral Daily   cholecalciferol  2,000 Units Oral Daily   citalopram  30 mg Oral Daily   cyanocobalamin  1,000 mcg Oral Daily   finasteride  5 mg Oral QPM   guaiFENesin-dextromethorphan  10 mL Oral Q8H   levETIRAcetam  1,500 mg Oral BID   melatonin  9 mg Oral QHS   pantoprazole  40 mg Oral Q1200   QUEtiapine   25 mg Oral QHS   zinc sulfate  220 mg Oral Daily   Continuous Infusions:   LOS: 7 days    Time spent: 36 minutes.     Hosie Poisson, MD [facility]   To contact the attending provider between 7A-7P or the covering provider during after hours 7P-7A, please log into the web site [email or web address] and access using universal [place] password for that web site. If you do not have the password, please call the hospital operator.  06/11/2022, 3:40 PM

## 2022-06-12 DIAGNOSIS — F0390 Unspecified dementia without behavioral disturbance: Secondary | ICD-10-CM | POA: Diagnosis not present

## 2022-06-12 DIAGNOSIS — I4891 Unspecified atrial fibrillation: Secondary | ICD-10-CM | POA: Diagnosis not present

## 2022-06-12 DIAGNOSIS — R262 Difficulty in walking, not elsewhere classified: Secondary | ICD-10-CM | POA: Diagnosis not present

## 2022-06-12 DIAGNOSIS — U071 COVID-19: Secondary | ICD-10-CM | POA: Diagnosis not present

## 2022-06-12 MED ORDER — DILTIAZEM HCL ER COATED BEADS 180 MG PO CP24
180.0000 mg | ORAL_CAPSULE | Freq: Every day | ORAL | Status: DC
  Administered 2022-06-13 – 2022-06-15 (×3): 180 mg via ORAL
  Filled 2022-06-12 (×3): qty 1

## 2022-06-12 NOTE — Progress Notes (Signed)
pt hasnt voided since this morning. Did a bladder scan and it was >200. Notified MD. Verbal order to in and out cath x1

## 2022-06-12 NOTE — Progress Notes (Signed)
PROGRESS NOTE    Carlos Ballard  YWV:[id number] DOB: 1928/03/15 DOA: 06/04/2022 PCP: Glenda Chroman, MD    Chief Complaint  Patient presents with   Weakness    Brief Narrative:   Carlos Ballard is a 88 y.o. male with medical history significant of atrial fibrillation on Eliquis, stroke, normal pressure hydrocephalus with shunt in place, dementia with occasional agitation, gastroesophageal reflux disease, and hypertension as well as a seizure disorder who presents for evaluation of multiple falls and increased weakness which started over the past 2 days.   Patient lives with his daughter and she states she has not seen his falls but he was on the floor beside his bed this morning when she first saw him.  He had another fall where he simply slid off the end of his bed that he was sitting on today.  He also fell in the bathroom striking the left side of his head but she did not witness that full and heard him and went to him immediately.  There was no loss of consciousness and he does not complain of pain.   Pt is currently waiting his isolation for discharge to SNF.  No new events overnight.  Constipation resolved.   Assessment & Plan:   Principal Problem:   Ambulatory dysfunction Active Problems:   COVID-19 virus infection   Atrial fibrillation (HCC)   NPH (normal pressure hydrocephalus) (HCC)   Seizure disorder (HCC)   Dementia without behavioral disturbance (HCC)   Weakness   Hematuria   Goals of care, counseling/discussion   Palliative care by specialist   Multiple falls at home. Probably secondary to generalized weakness and COVID 19 infection.  Therapy eval recommending SNF.  Daughter is hopeful to take him home If he improves by Monday and is able to walk. Daughter would like a list of caregivers to help him at home when he is ready for discharge.  Repeat PT eval ordered but patient refused it. Will request PT to see him tomorrow.  Currently waiting for SNF bed.    Covid 19 infection: Paxlovid was started, but daughter requested the medication to be discontinued.  No SOB, not on oxygen, he appears comfortable and appears to be at baseline today.    Atrial fibrillation:  Rate controlled , restarted cardizem. 240 mg daily.  On anti coagulation / eliquis.     Seizure Disorder:  Stable.  Continue with Keppra.  No seizure episodes.   Hematuria:  Exacerbated by in and out cath.  Eliquis was temporarily held.  Hematuria resolved.    Dementia without behavioral disturbance.  - sundowning with confusion and agitation.  - on seroquel and ativan. And melatonin.  - he appears to be back to baseline.    Constipation:  Started the patient on miralax nd senna and colace.   Resolved.   Due to patient's advanced age and multiple co morbidities, palliative care consulted and recommendations given.    Hypokalemia  Replaced. Repeat levels wnl.    Hypertension:  BP dropped with 240 mg of cardizem, will decrease the dose to 180 mg daily.      DVT prophylaxis: Eliquis Code Status: DNR.  Family Communication: none  Disposition:   Status is: Inpatient Remains inpatient appropriate because: SNF   Level of care: Med-Surg Consultants:  None.   Procedures: none.   Antimicrobials: none.   Subjective: No new events overnight.  No chest pain.   Objective: Vitals:   06/11/22 2045 06/12/22 8546 06/12/22 0659 06/12/22  1200  BP:  (!) 127/92  98/73  Pulse:  (!) 55  (!) 58  Resp: '18 18  17  '$ Temp:  (!) 97.5 F (36.4 C)  (!) 97.5 F (36.4 C)  TempSrc:  Oral  Oral  SpO2:  99%  99%  Weight:   81.6 kg   Height:        Intake/Output Summary (Last 24 hours) at 06/12/2022 1609 Last data filed at 06/12/2022 0800 Gross per 24 hour  Intake 480 ml  Output 1250 ml  Net -770 ml    Filed Weights   06/10/22 0631 06/11/22 0637 06/12/22 0659  Weight: 83.9 kg 83.9 kg 81.6 kg    Examination:  General exam: Appears calm and comfortable   Respiratory system: Clear to auscultation. Respiratory effort normal. Cardiovascular system: S1 & S2 heard, RRR. No JVD, No pedal edema. Gastrointestinal system: Abdomen is nondistended, soft and nontender. Normal bowel sounds heard. Central nervous system: Alert and confused, only recognized his daughter.  Extremities: no pedal edema.  Skin: No rashes, Psychiatry: J Mood & affect appropriate.       Data Reviewed: I have personally reviewed following labs and imaging studies  CBC: Recent Labs  Lab 06/06/22 0450 06/07/22 0341 06/08/22 0456 06/09/22 0438  WBC 4.8 3.1* 3.6* 4.4  NEUTROABS 2.5 1.3* 1.9 2.4  HGB 13.8 14.2 13.3 13.7  HCT 42.3 42.6 38.2* 41.0  MCV 99.5 97.7 95.3 97.9  PLT 144* 136* 170 148*     Basic Metabolic Panel: Recent Labs  Lab 06/06/22 0450 06/07/22 0341 06/08/22 0456 06/09/22 0438  NA 137 136 135 137  K 3.9 3.5 3.3* 3.9  CL 105 106 107 109  CO2 '25 25 25 24  '$ GLUCOSE 92 76 102* 94  BUN '11 12 12 14  '$ CREATININE 1.07 1.04 1.05 1.09  CALCIUM 8.6* 8.5* 8.5* 8.6*  MG 2.1 2.0 2.0 2.1  PHOS 2.9 2.6 2.7 3.1     GFR: Estimated Creatinine Clearance: 41.8 mL/min (by C-G formula based on SCr of 1.09 mg/dL).  Liver Function Tests: Recent Labs  Lab 06/06/22 0450 06/07/22 0341 06/08/22 0456 06/09/22 0438  AST '18 17 17 16  '$ ALT '15 15 16 16  '$ ALKPHOS 63 62 66 66  BILITOT 0.9 0.9 0.7 0.5  PROT 6.3* 6.1* 5.9* 6.1*  ALBUMIN 3.1* 2.9* 2.9* 3.0*     CBG: No results for input(s): "GLUCAP" in the last 168 hours.    Recent Results (from the past 240 hour(s))  SARS Coronavirus 2 by RT PCR (hospital order, performed in [facility] hospital lab) *cepheid single result test* Anterior Nasal Swab     Status: Abnormal   Collection Time: 06/04/22  3:40 PM   Specimen: Anterior Nasal Swab  Result Value Ref Range Status   SARS Coronavirus 2 by RT PCR POSITIVE (A) NEGATIVE Final    Comment: (NOTE) SARS-CoV-2 target nucleic acids are DETECTED  SARS-CoV-2  RNA is generally detectable in upper respiratory specimens  during the acute phase of infection.  Positive results are indicative  of the presence of the identified virus, but do not rule out bacterial infection or co-infection with other pathogens not detected by the test.  Clinical correlation with patient history and  other diagnostic information is necessary to determine patient infection status.  The expected result is negative.  Fact Sheet for Patients:   https://www.patel.info/   Fact Sheet for Healthcare Providers:   https://hall.com/    This test is not yet approved or cleared by  the [employer] and  has been authorized for detection and/or diagnosis of SARS-CoV-2 by FDA under an Emergency Use Authorization (EUA).  This EUA will remain in effect (meaning this test can be used) for the duration of  the COVID-19 declaration under Section 564(b)(1)  of the Act, 21 U.S.C. section 360-bbb-3(b)(1), unless the authorization is terminated or revoked sooner.   Performed at [facility], [address] [place], [place] [address]   Urine Culture     Status: Abnormal   Collection Time: 06/04/22  5:00 PM   Specimen: Urine, Clean Catch  Result Value Ref Range Status   Specimen Description   Final    URINE, CLEAN CATCH Performed at [facility], [address], [place], [place] [address]    Special Requests   Final    NONE Performed at [facility], [address], [place], [place] [address]    Culture MULTIPLE SPECIES PRESENT, SUGGEST RECOLLECTION (A)  Final   Report Status 06/07/2022 FINAL  Final         Radiology Studies: No results found.      Scheduled Meds:  apixaban  5 mg Oral BID   ascorbic acid  500 mg Oral Daily   cholecalciferol  2,000 Units Oral Daily   citalopram  30 mg Oral Daily   cyanocobalamin  1,000 mcg Oral Daily   diltiazem  240 mg Oral Daily   finasteride  5 mg Oral QPM    guaiFENesin-dextromethorphan  10 mL Oral Q8H   levETIRAcetam  1,500 mg Oral BID   melatonin  9 mg Oral QHS   pantoprazole  40 mg Oral Q1200   QUEtiapine  25 mg Oral QHS   zinc sulfate  220 mg Oral Daily   Continuous Infusions:   LOS: 8 days    Time spent: 38 minutes.     Hosie Poisson, MD [facility]   To contact the attending provider between 7A-7P or the covering provider during after hours 7P-7A, please log into the web site [email or web address] and access using universal [place] password for that web site. If you do not have the password, please call the hospital operator.  06/12/2022, 4:09 PM

## 2022-06-12 NOTE — Progress Notes (Signed)
Daughter called me to the room and stated patient had voided. There was 200 ml of urine in urinal

## 2022-06-12 NOTE — Progress Notes (Signed)
Partial assessment completed. Patient is agitated and stated that I was going to die. I understand patient is confused. Will attempt assessment again later in shift.

## 2022-06-13 DIAGNOSIS — I4891 Unspecified atrial fibrillation: Secondary | ICD-10-CM | POA: Diagnosis not present

## 2022-06-13 DIAGNOSIS — F0390 Unspecified dementia without behavioral disturbance: Secondary | ICD-10-CM | POA: Diagnosis not present

## 2022-06-13 DIAGNOSIS — R262 Difficulty in walking, not elsewhere classified: Secondary | ICD-10-CM | POA: Diagnosis not present

## 2022-06-13 DIAGNOSIS — U071 COVID-19: Secondary | ICD-10-CM | POA: Diagnosis not present

## 2022-06-13 NOTE — Progress Notes (Signed)
PROGRESS NOTE    Carlos Ballard  DTO:[id number] DOB: May 10, 1928 DOA: 06/04/2022 PCP: Glenda Chroman, MD    Chief Complaint  Patient presents with   Weakness    Brief Narrative:   Carlos Ballard is a 88 y.o. male with medical history significant of atrial fibrillation on Eliquis, stroke, normal pressure hydrocephalus with shunt in place, dementia with occasional agitation, gastroesophageal reflux disease, and hypertension as well as a seizure disorder who presents for evaluation of multiple falls and increased weakness which started over the past 2 days.   Patient lives with his daughter and she states she has not seen his falls but he was on the floor beside his bed this morning when she first saw him.  He had another fall where he simply slid off the end of his bed that he was sitting on today.  He also fell in the bathroom striking the left side of his head but she did not witness that full and heard him and went to him immediately.  There was no loss of consciousness and he does not complain of pain.  He was found to have COVID 19, was initially started on Paxlovid, but daughter refused further management.  Patient appears to be back to baseline. Therapy eval recommending SNF. His isolation will be completed by 06/14/22 and he is good to be discharged on 06/15/22 . Pt seen and   Assessment & Plan:   Principal Problem:   Ambulatory dysfunction Active Problems:   COVID-19 virus infection   Atrial fibrillation (HCC)   NPH (normal pressure hydrocephalus) (HCC)   Seizure disorder (HCC)   Dementia without behavioral disturbance (HCC)   Weakness   Hematuria   Goals of care, counseling/discussion   Palliative care by specialist   Multiple falls at home. Probably secondary to generalized weakness and COVID 19 infection.  Therapy eval recommending SNF.  Initially Daughter is hopeful to take him home If he improves by Monday and is able to walk. But today she is agreeable for SNF.   Currently waiting for SNF bed.   Covid 19 infection: Paxlovid was started, but daughter requested the medication to be discontinued.  No SOB, not on oxygen, he appears comfortable and appears to be at baseline today.    Atrial fibrillation:  Rate controlled , Continue with Cardizem 180 mg daily.  On anti coagulation / eliquis.     Seizure Disorder:  Stable.  Continue with Keppra.  No seizure episodes.   Hematuria:  Exacerbated by in and out cath.  Eliquis was temporarily held.  Hematuria resolved.    Dementia without behavioral disturbance.  - sundowning with confusion and agitation.  - on seroquel and ativan. And melatonin.  - he appears to be back to baseline.    Constipation:  Started the patient on miralax and senna and colace.   Resolved.   Due to patient's advanced age and multiple co morbidities, palliative care consulted and recommendations given.    Hypokalemia  Replaced. Repeat levels wnl.    Hypertension:  BP parameters are optimal.      DVT prophylaxis: Eliquis Code Status: DNR.  Family Communication: none  Disposition:   Status is: Inpatient Remains inpatient appropriate because: SNF   Level of care: Med-Surg Consultants:  None.   Procedures: none.   Antimicrobials: none.   Subjective: Overnight pt had urinary retention, in and out cath done.  Post that he had pure wick placed.   Objective: Vitals:   06/12/22 1200 06/12/22  2159 06/13/22 0500 06/13/22 0604  BP: 98/73 116/76  113/67  Pulse: (!) 58 (!) 57  64  Resp: '17 17  18  '$ Temp: (!) 97.5 F (36.4 C)   (!) 97.2 F (36.2 C)  TempSrc: Oral     SpO2: 99% 98%  97%  Weight:   83.6 kg   Height:        Intake/Output Summary (Last 24 hours) at 06/13/2022 1513 Last data filed at 06/13/2022 0900 Gross per 24 hour  Intake 820 ml  Output 0 ml  Net 820 ml    Filed Weights   06/11/22 0637 06/12/22 0659 06/13/22 0500  Weight: 83.9 kg 81.6 kg 83.6 kg     Examination:  General exam: Appears calm and comfortable  Respiratory system: Clear to auscultation. Respiratory effort normal. Cardiovascular system: S1 & S2 heard, RRR. No JVD,  No pedal edema. Gastrointestinal system: Abdomen is nondistended, soft and nontender. . Normal bowel sounds heard. Central nervous system: Alert and oriented to person only.  Extremities: Symmetric 5 x 5 power. Skin: No rashes, lesions or ulcers Psychiatry: Mood is appropriate.        Data Reviewed: I have personally reviewed following labs and imaging studies  CBC: Recent Labs  Lab 06/07/22 0341 06/08/22 0456 06/09/22 0438  WBC 3.1* 3.6* 4.4  NEUTROABS 1.3* 1.9 2.4  HGB 14.2 13.3 13.7  HCT 42.6 38.2* 41.0  MCV 97.7 95.3 97.9  PLT 136* 170 148*     Basic Metabolic Panel: Recent Labs  Lab 06/07/22 0341 06/08/22 0456 06/09/22 0438  NA 136 135 137  K 3.5 3.3* 3.9  CL 106 107 109  CO2 '25 25 24  '$ GLUCOSE 76 102* 94  BUN '12 12 14  '$ CREATININE 1.04 1.05 1.09  CALCIUM 8.5* 8.5* 8.6*  MG 2.0 2.0 2.1  PHOS 2.6 2.7 3.1     GFR: Estimated Creatinine Clearance: 45.6 mL/min (by C-G formula based on SCr of 1.09 mg/dL).  Liver Function Tests: Recent Labs  Lab 06/07/22 0341 06/08/22 0456 06/09/22 0438  AST '17 17 16  '$ ALT '15 16 16  '$ ALKPHOS 62 66 66  BILITOT 0.9 0.7 0.5  PROT 6.1* 5.9* 6.1*  ALBUMIN 2.9* 2.9* 3.0*     CBG: No results for input(s): "GLUCAP" in the last 168 hours.    Recent Results (from the past 240 hour(s))  SARS Coronavirus 2 by RT PCR (hospital order, performed in [facility] hospital lab) *cepheid single result test* Anterior Nasal Swab     Status: Abnormal   Collection Time: 06/04/22  3:40 PM   Specimen: Anterior Nasal Swab  Result Value Ref Range Status   SARS Coronavirus 2 by RT PCR POSITIVE (A) NEGATIVE Final    Comment: (NOTE) SARS-CoV-2 target nucleic acids are DETECTED  SARS-CoV-2 RNA is generally detectable in upper respiratory specimens   during the acute phase of infection.  Positive results are indicative  of the presence of the identified virus, but do not rule out bacterial infection or co-infection with other pathogens not detected by the test.  Clinical correlation with patient history and  other diagnostic information is necessary to determine patient infection status.  The expected result is negative.  Fact Sheet for Patients:   https://www.patel.info/   Fact Sheet for Healthcare Providers:   https://hall.com/    This test is not yet approved or cleared by the [place] FDA and  has been authorized for detection and/or diagnosis of SARS-CoV-2 by FDA under an Emergency Use  Authorization (EUA).  This EUA will remain in effect (meaning this test can be used) for the duration of  the COVID-19 declaration under Section 564(b)(1)  of the Act, 21 U.S.C. section 360-bbb-3(b)(1), unless the authorization is terminated or revoked sooner.   Performed at [facility], [address] [place], [place] [address]   Urine Culture     Status: Abnormal   Collection Time: 06/04/22  5:00 PM   Specimen: Urine, Clean Catch  Result Value Ref Range Status   Specimen Description   Final    URINE, CLEAN CATCH Performed at [facility], [address], [place], [place] [address]    Special Requests   Final    NONE Performed at [facility], [address] [place], [place] [address]    Culture MULTIPLE SPECIES PRESENT, SUGGEST RECOLLECTION (A)  Final   Report Status 06/07/2022 FINAL  Final         Radiology Studies: No results found.      Scheduled Meds:  apixaban  5 mg Oral BID   ascorbic acid  500 mg Oral Daily   cholecalciferol  2,000 Units Oral Daily   citalopram  30 mg Oral Daily   cyanocobalamin  1,000 mcg Oral Daily   diltiazem  180 mg Oral Daily   finasteride  5 mg Oral QPM   guaiFENesin-dextromethorphan  10 mL Oral Q8H   levETIRAcetam  1,500 mg Oral BID    melatonin  9 mg Oral QHS   pantoprazole  40 mg Oral Q1200   QUEtiapine  25 mg Oral QHS   zinc sulfate  220 mg Oral Daily   Continuous Infusions:   LOS: 9 days    Time spent: 35 minutes.     Hosie Poisson, MD [facility]   To contact the attending provider between 7A-7P or the covering provider during after hours 7P-7A, please log into the web site [email or web address] and access using universal [place] password for that web site. If you do not have the password, please call the hospital operator.  06/13/2022, 3:13 PM

## 2022-06-13 NOTE — Plan of Care (Signed)
  Problem: Coping: Goal: Psychosocial and spiritual needs will be supported Outcome: Progressing   Problem: Respiratory: Goal: Will maintain a patent airway Outcome: Progressing   Problem: Education: Goal: Knowledge of General Education information will improve Description: Including pain rating scale, medication(s)/side effects and non-pharmacologic comfort measures Outcome: Progressing   Problem: Health Behavior/Discharge Planning: Goal: Ability to manage health-related needs will improve Outcome: Progressing

## 2022-06-13 NOTE — Progress Notes (Signed)
Nursing staff called in by pt's daughter that was at bedside that pt had wet the bed. Nursing assessment that there was no male external cath on the pt, and bed was soiled. Nursing staff assisted pt in ADL'S, Changed bed linen, and gave pt a bed bath. Pt tolerated well. Will continue to monitor. Cal bell place in reach, bed alarm on, Yellow grip socks put on pt. Will continue to monitor

## 2022-06-14 DIAGNOSIS — R262 Difficulty in walking, not elsewhere classified: Secondary | ICD-10-CM | POA: Diagnosis not present

## 2022-06-14 DIAGNOSIS — I4891 Unspecified atrial fibrillation: Secondary | ICD-10-CM | POA: Diagnosis not present

## 2022-06-14 DIAGNOSIS — F0390 Unspecified dementia without behavioral disturbance: Secondary | ICD-10-CM | POA: Diagnosis not present

## 2022-06-14 MED ORDER — POLYETHYLENE GLYCOL 3350 17 G PO PACK: 17.0000 g | PACK | Freq: Every day | ORAL | 0 refills | Status: AC | PRN

## 2022-06-14 MED ORDER — ASCORBIC ACID 500 MG PO TABS: 500.0000 mg | ORAL_TABLET | Freq: Every day | ORAL | Status: AC

## 2022-06-14 MED ORDER — LEVETIRACETAM IN NACL 1500 MG/100ML IV SOLN
1500.0000 mg | Freq: Once | INTRAVENOUS | Status: AC
  Administered 2022-06-14: 1500 mg via INTRAVENOUS
  Filled 2022-06-14: qty 100

## 2022-06-14 MED ORDER — ALBUTEROL SULFATE HFA 108 (90 BASE) MCG/ACT IN AERS: 2.0000 | INHALATION_SPRAY | RESPIRATORY_TRACT | Status: AC | PRN

## 2022-06-14 MED ORDER — QUETIAPINE FUMARATE 25 MG PO TABS: 25.0000 mg | ORAL_TABLET | Freq: Every day | ORAL | 0 refills | Status: AC

## 2022-06-14 MED ORDER — DILTIAZEM HCL ER COATED BEADS 180 MG PO CP24: 180.0000 mg | ORAL_CAPSULE | Freq: Every day | ORAL | Status: AC

## 2022-06-14 MED ORDER — GUAIFENESIN-DM 100-10 MG/5ML PO SYRP: 10.0000 mL | ORAL_SOLUTION | Freq: Three times a day (TID) | ORAL | 0 refills | Status: AC

## 2022-06-14 MED ORDER — ZINC SULFATE 220 (50 ZN) MG PO CAPS
220.0000 mg | ORAL_CAPSULE | Freq: Every day | ORAL | Status: AC
Start: 2022-06-15 — End: ?

## 2022-06-14 MED ORDER — SENNOSIDES-DOCUSATE SODIUM 8.6-50 MG PO TABS: 1.0000 | ORAL_TABLET | Freq: Every evening | ORAL | Status: AC | PRN

## 2022-06-14 NOTE — Progress Notes (Signed)
Pt. Did not receive nighttime PO meds. Pt. was already sleep and won't wake up per daughter at bedside. Daughter concerned about pt. Having seizures if Keppra not given early. RN notified. MD notified with an order for IV Keppra.

## 2022-06-14 NOTE — Progress Notes (Signed)
Pt. Daughter at bedside, did not want pt. To be woken up. Wants to wait until he wakes up on his own to get meds, vitals, and weight. Charge nurse notified.

## 2022-06-14 NOTE — Discharge Summary (Signed)
Physician Discharge Summary   Patient: Carlos Ballard MRN: [id number] DOB: [birth year]  Admit date:     06/04/2022  Discharge date: 06/15/2022  Discharge Physician: Hosie Poisson   PCP: Glenda Chroman, MD   Recommendations at discharge:  Please follow up with PCP in one week.  Please follow up with Palliative care at [facility] [address]   Discharge Diagnoses: Principal Problem:   Ambulatory dysfunction Active Problems:   COVID-19 virus infection   Atrial fibrillation (HCC)   NPH (normal pressure hydrocephalus) (HCC)   Seizure disorder (HCC)   Dementia without behavioral disturbance (HCC)   Weakness   Hematuria   Goals of care, counseling/discussion   Palliative care by specialist    Hospital Course:  Carlos Ballard is a 88 y.o. male with medical history significant of atrial fibrillation on Eliquis, stroke, normal pressure hydrocephalus with shunt in place, dementia with occasional agitation, gastroesophageal reflux disease, and hypertension as well as a seizure disorder who presents for evaluation of multiple falls and increased weakness which started over the past 2 days.   Patient lives with his daughter and she states she has not seen his falls but he was on the floor beside his bed this morning when she first saw him.  He had another fall where he simply slid off the end of his bed that he was sitting on today.  He also fell in the bathroom striking the left side of his head but she did not witness that full and heard him and went to him immediately.  There was no loss of consciousness and he does not complain of pain.  He was found to have COVID 19, was initially started on Paxlovid, but daughter refused further management.  Patient appears to be back to baseline. Therapy eval recommending SNF. His isolation will be completed by 06/14/22 and he is good to be discharged on 06/15/22 . Assessment and Plan:  Multiple falls at home. Probably secondary to generalized weakness and COVID 19  infection.  Therapy eval recommending SNF.  Initially Daughter is hopeful to take him home If he improves by Monday and is able to walk. But today she is agreeable for SNF.  Currently waiting for SNF bed.    Covid 19 infection: Paxlovid was started, but daughter requested the medication to be discontinued.  No SOB, not on oxygen, he appears comfortable and appears to be at baseline today.      Atrial fibrillation:  Rate controlled , Continue with Cardizem 180 mg daily.  On anti coagulation / eliquis.        Seizure Disorder:  Stable.  Continue with Keppra.  No seizure episodes.    Hematuria:  Exacerbated by in and out cath.  Hematuria resolved.      Dementia without behavioral disturbance.  - sundowning with confusion and agitation.  - on seroquel and  melatonin.  - he appears to be back to baseline.      Constipation:  Started the patient on miralax and senna and colace.   Resolved.    Due to patient's advanced age and multiple co morbidities, palliative care consulted and recommendations given.      Hypokalemia  Replaced. Repeat levels wnl.      Hypertension:  BP parameters are optimal.       Consultants: palliative care.  Procedures performed: none.   Disposition: Skilled nursing facility Diet recommendation:  Regular diet DISCHARGE MEDICATION: Allergies as of 06/14/2022       Reactions   Depakote [  divalproex Sodium] Swelling, Other (See Comments)    low blood counts   Phenytek [phenytoin Sodium Extended] Other (See Comments)   ineffective        Medication List     STOP taking these medications    rosuvastatin 5 MG tablet Commonly known as: CRESTOR       TAKE these medications    albuterol 108 (90 Base) MCG/ACT inhaler Commonly known as: VENTOLIN HFA Inhale 2 puffs into the lungs every 4 (four) hours as needed for wheezing or shortness of breath.   ascorbic acid 500 MG tablet Commonly known as: VITAMIN C Take 1 tablet (500 mg  total) by mouth daily. Start taking on: June 15, 2022   Cholecalciferol 50 MCG (2000 UT) Caps Take 1 capsule by mouth daily.   citalopram 20 MG tablet Commonly known as: CELEXA Take 20 mg by mouth 2 (two) times daily.   cyanocobalamin 1000 MCG tablet Commonly known as: VITAMIN B12 Take 1,000 mcg by mouth daily.   diltiazem 180 MG 24 hr capsule Commonly known as: CARDIZEM CD Take 1 capsule (180 mg total) by mouth daily. Start taking on: June 15, 2022 What changed:  medication strength how much to take when to take this   Eliquis 5 MG Tabs tablet Generic drug: apixaban Take 5 mg by mouth 2 (two) times daily.   esomeprazole 40 MG capsule Commonly known as: NEXIUM Take 20 mg by mouth daily as needed (acid reflux).   finasteride 5 MG tablet Commonly known as: PROSCAR Take 5 mg by mouth every evening.   guaiFENesin-dextromethorphan 100-10 MG/5ML syrup Commonly known as: ROBITUSSIN DM Take 10 mLs by mouth every 8 (eight) hours.   Keppra 750 MG tablet Generic drug: levETIRAcetam Take 2 tablets (1,500 mg total) by mouth 2 (two) times daily. Brand medically necessary   loratadine 10 MG tablet Commonly known as: CLARITIN Take 10 mg by mouth daily as needed for allergies.   losartan 100 MG tablet Commonly known as: COZAAR Take 100 mg by mouth daily.   melatonin 5 MG Tabs Take 2.5-10 mg by mouth at bedtime. Takes 2.5 mg in the afternoon and 10 mg at bedtime.   polyethylene glycol 17 g packet Commonly known as: MIRALAX / GLYCOLAX Take 17 g by mouth daily as needed for mild constipation.   QUEtiapine 25 MG tablet Commonly known as: SEROQUEL Take 1 tablet (25 mg total) by mouth at bedtime.   senna-docusate 8.6-50 MG tablet Commonly known as: Senokot-S Take 1 tablet by mouth at bedtime as needed for mild constipation.   zinc sulfate 220 (50 Zn) MG capsule Take 1 capsule (220 mg total) by mouth daily. Start taking on: June 15, 2022        Contact  information for after-discharge care     [facility] .   Service: Skilled Nursing Contact information: [address] [place] [phone]                    Discharge Exam: Filed Weights   06/11/22 2595 06/12/22 0659 06/13/22 0500  Weight: 83.9 kg 81.6 kg 83.6 kg   General exam: Appears calm and comfortable  Respiratory system: Clear to auscultation. Respiratory effort normal. Cardiovascular system: S1 & S2 heard, RRR. No JVD,  No pedal edema. Gastrointestinal system: Abdomen is nondistended, soft and nontender. Normal bowel sounds heard. Central nervous system: Alert , pleasantly confused.  Extremities: Symmetric 5  x 5 power. Skin: No rashes, lesions or ulcers Psychiatry: calm.    Condition at discharge: fair  The results of significant diagnostics from this hospitalization (including imaging, microbiology, ancillary and laboratory) are listed below for reference.   Imaging Studies: DG Chest Portable 1 View  Result Date: 06/04/2022 CLINICAL DATA:  Fever, weakness EXAM: PORTABLE CHEST 1 VIEW COMPARISON:  08/18/2009 FINDINGS: Transverse diameter of heart is in upper limits of normal. Thoracic aorta is tortuous. Lung fields are clear of any infiltrates or pulmonary edema. There is no pleural effusion or pneumothorax. A catheter is noted in right side of neck and right chest, most likely ventriculoperitoneal shunt. IMPRESSION: There are no signs of pulmonary edema or focal pulmonary consolidation. Electronically Signed   By: Elmer Picker M.D.   On: 06/04/2022 16:29   CT HEAD WO CONTRAST  Result Date: 06/04/2022 CLINICAL DATA:  Head trauma, fall EXAM: CT HEAD WITHOUT CONTRAST TECHNIQUE: Contiguous axial images were obtained from the base of the skull through the vertex without intravenous contrast. RADIATION DOSE REDUCTION: This exam was performed according to the departmental  dose-optimization program which includes automated exposure control, adjustment of the mA and/or kV according to patient size and/or use of iterative reconstruction technique. COMPARISON:  06/11/2019 FINDINGS: Brain: No evidence of acute infarction, hemorrhage, hydrocephalus, extra-axial collection or mass lesion/mass effect. Unchanged right parietal approach intraventricular shunt catheter. Extensive periventricular and deep white matter hypodensity with mild global cerebral volume loss. 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 Vascular: No hyperdense vessel or unexpected calcification. Skull: Normal. Negative for fracture or focal lesion. Sinuses/Orbits: No acute finding. Other: None. IMPRESSION: 1. No acute intracranial pathology. 2. Unchanged right parietal approach intraventricular shunt catheter. No hydrocephalus. 3. Extensive periventricular and deep white matter hypodensity with mild global cerebral volume loss, in keeping with advanced patient age. 4. 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 Electronically Signed   By: Delanna Ahmadi M.D.   On: 06/04/2022 16:23    Microbiology: Results for orders placed or performed during the hospital encounter of 06/04/22  SARS Coronavirus 2 by RT PCR (hospital order, performed in [facility] hospital lab) *cepheid single result test* Anterior Nasal Swab     Status: Abnormal   Collection Time: 06/04/22  3:40 PM   Specimen: Anterior Nasal Swab  Result Value Ref Range Status   SARS Coronavirus 2 by RT PCR POSITIVE (A) NEGATIVE Final    Comment: (NOTE) SARS-CoV-2 target nucleic acids are DETECTED  SARS-CoV-2 RNA is generally detectable in upper respiratory specimens  during the acute phase of infection.  Positive results are indicative  of the presence of the identified virus, but do not rule out bacterial infection or co-infection with other pathogens not detected by the test.  Clinical correlation with patient history and  other  diagnostic information is necessary to determine patient infection status.  The expected result is negative.  Fact Sheet for Patients:   https://www.patel.info/   Fact Sheet for Healthcare Providers:   https://hall.com/    This test is not yet approved or cleared by the [place] FDA and  has been authorized for detection and/or diagnosis of SARS-CoV-2 by FDA under an Emergency Use Authorization (EUA).  This EUA will remain in effect (meaning this test can be used) for the duration of  the COVID-19 declaration under Section 564(b)(1)  of the Act, 21 U.S.C. section 360-bbb-3(b)(1), unless the authorization is terminated or revoked sooner.   Performed at [facility] [address] [place], [place] [address]  Urine Culture     Status: Abnormal   Collection Time: 06/04/22  5:00 PM   Specimen: Urine, Clean Catch  Result Value Ref Range Status   Specimen Description   Final    URINE, CLEAN CATCH Performed at [facility], [address], [place], [place] [address]    Special Requests   Final    NONE Performed at [facility], [address] [place], [place] [address]    Culture MULTIPLE SPECIES PRESENT, SUGGEST RECOLLECTION (A)  Final   Report Status 06/07/2022 FINAL  Final    Labs: CBC: Recent Labs  Lab 06/08/22 0456 06/09/22 0438  WBC 3.6* 4.4  NEUTROABS 1.9 2.4  HGB 13.3 13.7  HCT 38.2* 41.0  MCV 95.3 97.9  PLT 170 300*   Basic Metabolic Panel: Recent Labs  Lab 06/08/22 0456 06/09/22 0438  NA 135 137  K 3.3* 3.9  CL 107 109  CO2 25 24  GLUCOSE 102* 94  BUN 12 14  CREATININE 1.05 1.09  CALCIUM 8.5* 8.6*  MG 2.0 2.1  PHOS 2.7 3.1   Liver Function Tests: Recent Labs  Lab 06/08/22 0456 06/09/22 0438  AST 17 16  ALT 16 16  ALKPHOS 66 66  BILITOT 0.7 0.5  PROT 5.9* 6.1*  ALBUMIN 2.9* 3.0*   CBG: No results for input(s): "GLUCAP" in the last 168 hours.  Discharge time spent: 38 minutes.    Signed: Hosie Poisson, MD [facility] 06/14/2022

## 2022-06-14 NOTE — Progress Notes (Signed)
Family request IV Keppra because they refused to let patient be woken from sleep for p.o. Keppra.  IV Keppra ordered x1 dose for tonight.  RN reports nobody has actually attempted to wake this patient as family is refusing.

## 2022-06-14 NOTE — Progress Notes (Signed)
Physical Therapy Treatment Patient Details Name: Carlos Ballard MRN: [id number] DOB: 09-03-1928 Today's Date: 06/14/2022   History of Present Illness Kamal Jurgens is a 88 y.o. male with medical history significant of atrial fibrillation on Eliquis, stroke, normal pressure hydrocephalus with shunt in place, dementia with occasional agitation, gastroesophageal reflux disease, and hypertension as well as a seizure disorder who presents for evaluation of multiple falls and increased weakness which started over the past 2 days.  Patient lives with his daughter and she states she has not seen his falls but he was on the floor beside his bed this morning when she first saw him.  He had another fall where he simply slid off the end of his bed that he was sitting on today.  He also fell in the bathroom striking the left side of his head but she did not witness that full and heard him and went to him immediately.  There was no loss of consciousness and he does not complain of pain.  He had no noticeable injury with these falls.  He has been eating well today with no fevers, no vomiting, no diarrhea, no complaints of chest pain or abdominal pain, no shortness of breath, no headaches.  He has been shuffling his left foot a little bit more than normally with ambulation and uses a [employer] at baseline.  She has noticed this change in his gait over the past several days.  Review of his chart shows that he has small left pontine stroke in 2010 with staring episodes every 6 weeks for which he is treated for as seizures.  History is mostly provided by the patient's daughter.    PT Comments    Patient agreeable for therapy and able to follow directions consistently with occasional repeated verbal and tactile cueing.  Patient demonstrates slightly labored slow movement for sitting up at bedside with [facility] partially raised, required repeated attempts before able to stand with RW, increased endurance/distance for gait  training with slow labored cadence and requiring increased time to make turns due to unsteadiness and wide base of support.  Patient tolerated sitting up in chair after therapy with caregiver present in room - nurse notified.  Patient will benefit from continued skilled physical therapy in hospital and recommended venue below to increase strength, balance, endurance for safe ADLs and gait.    Recommendations for follow up therapy are one component of a multi-disciplinary discharge planning process, led by the attending physician.  Recommendations may be updated based on patient status, additional functional criteria and insurance authorization.  Follow Up Recommendations  Skilled nursing-short term rehab (<3 hours/day) Can patient physically be transported by private vehicle: No   Assistance Recommended at Discharge    Patient can return home with the following A lot of help with bathing/dressing/bathroom;A lot of help with walking and/or transfers;Assistance with cooking/housework;Help with stairs or ramp for entrance   Equipment Recommendations  None recommended by PT    Recommendations for Other Services       Precautions / Restrictions Precautions Precautions: Fall Restrictions Weight Bearing Restrictions: No     Mobility  Bed Mobility Overal bed mobility: Needs Assistance Bed Mobility: Supine to Sit     Supine to sit: Min assist, HOB elevated     General bed mobility comments: increased time, labored movement    Transfers Overall transfer level: Needs assistance Equipment used: [place] (2 wheels) Transfers: Sit to/from Stand, Bed to chair/wheelchair/BSC Sit to Stand: Min assist, Mod assist  Step pivot transfers: Min assist, Mod assist       General transfer comment: increased time, labored movement    Ambulation/Gait Ambulation/Gait assistance: Mod assist Gait Distance (Feet): 22 Feet Assistive device: Rolling [place] (2 wheels) Gait  Pattern/deviations: Decreased step length - left, Decreased stride length, Decreased step length - right, Trunk flexed, Decreased stance time - right Gait velocity: decreased     General Gait Details: slightly increased endurance/distance for ambulating in room with slow labored cadence, flexed turnk, had most difficulty making turns due to wide base of support requiring repeated verbal/tactile cue to avoid loss of balance   Stairs             Wheelchair Mobility    Modified Rankin (Stroke Patients Only)       Balance Overall balance assessment: Needs assistance Sitting-balance support: Feet supported, No upper extremity supported Sitting balance-Leahy Scale: Fair Sitting balance - Comments: fair/good seated at EOB   Standing balance support: During functional activity, Bilateral upper extremity supported Standing balance-Leahy Scale: Poor Standing balance comment: fair/poor using RW                            Cognition Arousal/Alertness: Awake/alert Behavior During Therapy: WFL for tasks assessed/performed Overall Cognitive Status: History of cognitive impairments - at baseline                                 General Comments: Dementia with occasional agitation        Exercises      General Comments        Pertinent Vitals/Pain Pain Assessment Pain Assessment: No/denies pain    Home Living                          Prior Function            PT Goals (current goals can now be found in the care plan section) Acute Rehab PT Goals Patient Stated Goal: return home with family to assist PT Goal Formulation: With patient/family Time For Goal Achievement: 06/21/22 Potential to Achieve Goals: Good Progress towards PT goals: Progressing toward goals    Frequency    Min 3X/week      PT Plan Current plan remains appropriate    Co-evaluation              AM-PAC PT "6 Clicks" Mobility   Outcome Measure   Help needed turning from your back to your side while in a flat bed without using bedrails?: A Little Help needed moving from lying on your back to sitting on the side of a flat bed without using bedrails?: A Little Help needed moving to and from a bed to a chair (including a wheelchair)?: A Lot Help needed standing up from a chair using your arms (e.g., wheelchair or bedside chair)?: A Little Help needed to walk in hospital room?: A Lot Help needed climbing 3-5 steps with a railing? : A Lot 6 Click Score: 15    End of Session   Activity Tolerance: Patient tolerated treatment well;Patient limited by fatigue Patient left: in chair;with call bell/phone within reach;with family/visitor present Nurse Communication: Mobility status PT Visit Diagnosis: Unsteadiness on feet (R26.81);Other abnormalities of gait and mobility (R26.89);Muscle weakness (generalized) (M62.81)     Time: 1025-8527 PT Time Calculation (min) (ACUTE ONLY): 27 min  Charges:  $  Gait Training: 8-22 mins $Therapeutic Activity: 8-22 mins                     12:15 PM, 06/14/22 Lonell Grandchild, MPT Physical Therapist with [facility] [phone] office [phone] mobile phone

## 2022-06-15 DIAGNOSIS — R5381 Other malaise: Secondary | ICD-10-CM | POA: Diagnosis not present

## 2022-06-15 DIAGNOSIS — R319 Hematuria, unspecified: Secondary | ICD-10-CM | POA: Diagnosis not present

## 2022-06-15 DIAGNOSIS — R2689 Other abnormalities of gait and mobility: Secondary | ICD-10-CM | POA: Diagnosis not present

## 2022-06-15 DIAGNOSIS — R262 Difficulty in walking, not elsewhere classified: Secondary | ICD-10-CM | POA: Diagnosis not present

## 2022-06-15 DIAGNOSIS — R269 Unspecified abnormalities of gait and mobility: Secondary | ICD-10-CM | POA: Diagnosis not present

## 2022-06-15 DIAGNOSIS — G912 (Idiopathic) normal pressure hydrocephalus: Secondary | ICD-10-CM | POA: Diagnosis not present

## 2022-06-15 DIAGNOSIS — E559 Vitamin D deficiency, unspecified: Secondary | ICD-10-CM | POA: Diagnosis not present

## 2022-06-15 DIAGNOSIS — U071 COVID-19: Secondary | ICD-10-CM | POA: Diagnosis not present

## 2022-06-15 DIAGNOSIS — R609 Edema, unspecified: Secondary | ICD-10-CM | POA: Diagnosis not present

## 2022-06-15 DIAGNOSIS — I4891 Unspecified atrial fibrillation: Secondary | ICD-10-CM | POA: Diagnosis not present

## 2022-06-15 DIAGNOSIS — I10 Essential (primary) hypertension: Secondary | ICD-10-CM | POA: Diagnosis not present

## 2022-06-15 DIAGNOSIS — M6281 Muscle weakness (generalized): Secondary | ICD-10-CM | POA: Diagnosis not present

## 2022-06-15 DIAGNOSIS — Z5181 Encounter for therapeutic drug level monitoring: Secondary | ICD-10-CM | POA: Diagnosis not present

## 2022-06-15 DIAGNOSIS — N390 Urinary tract infection, site not specified: Secondary | ICD-10-CM | POA: Diagnosis not present

## 2022-06-15 DIAGNOSIS — W19XXXA Unspecified fall, initial encounter: Secondary | ICD-10-CM | POA: Diagnosis not present

## 2022-06-15 DIAGNOSIS — R451 Restlessness and agitation: Secondary | ICD-10-CM | POA: Diagnosis not present

## 2022-06-15 DIAGNOSIS — G919 Hydrocephalus, unspecified: Secondary | ICD-10-CM | POA: Diagnosis not present

## 2022-06-15 NOTE — Progress Notes (Signed)
Patient seen and examined; no acute distress and hemodynamically stable.  No overnight events.  Discharge summary dictated on 06/14/2022 by Dr. Karleen Hampshire has been reviewed and no changes needed.  Patient to be discharged to skilled nursing facility for further care and rehabilitation.  Please refer to discharge summary for further info/instructions.  Patient is medically clear for discharge at this point.  Barton Dubois MD [phone]

## 2022-06-15 NOTE — Progress Notes (Signed)
Ng Discharge Note  Admit Date:  06/04/2022 Discharge date: 06/15/2022   Carlos Ballard to be D/C'd Skilled nursing facility per MD order.  AVS completed. Patient/caregiver able to verbalize understanding.  Discharge Medication: Allergies as of 06/15/2022       Reactions   Depakote [divalproex Sodium] Swelling, Other (See Comments)    low blood counts   Phenytek [phenytoin Sodium Extended] Other (See Comments)   ineffective        Medication List     STOP taking these medications    rosuvastatin 5 MG tablet Commonly known as: CRESTOR       TAKE these medications    albuterol 108 (90 Base) MCG/ACT inhaler Commonly known as: VENTOLIN HFA Inhale 2 puffs into the lungs every 4 (four) hours as needed for wheezing or shortness of breath.   ascorbic acid 500 MG tablet Commonly known as: VITAMIN C Take 1 tablet (500 mg total) by mouth daily.   Cholecalciferol 50 MCG (2000 UT) Caps Take 1 capsule by mouth daily.   citalopram 20 MG tablet Commonly known as: CELEXA Take 20 mg by mouth 2 (two) times daily.   cyanocobalamin 1000 MCG tablet Commonly known as: VITAMIN B12 Take 1,000 mcg by mouth daily.   diltiazem 180 MG 24 hr capsule Commonly known as: CARDIZEM CD Take 1 capsule (180 mg total) by mouth daily. What changed:  medication strength how much to take when to take this   Eliquis 5 MG Tabs tablet Generic drug: apixaban Take 5 mg by mouth 2 (two) times daily.   esomeprazole 40 MG capsule Commonly known as: NEXIUM Take 20 mg by mouth daily as needed (acid reflux).   finasteride 5 MG tablet Commonly known as: PROSCAR Take 5 mg by mouth every evening.   guaiFENesin-dextromethorphan 100-10 MG/5ML syrup Commonly known as: ROBITUSSIN DM Take 10 mLs by mouth every 8 (eight) hours.   Keppra 750 MG tablet Generic drug: levETIRAcetam Take 2 tablets (1,500 mg total) by mouth 2 (two) times daily. Brand medically necessary   loratadine 10 MG  tablet Commonly known as: CLARITIN Take 10 mg by mouth daily as needed for allergies.   losartan 100 MG tablet Commonly known as: COZAAR Take 100 mg by mouth daily.   melatonin 5 MG Tabs Take 2.5-10 mg by mouth at bedtime. Takes 2.5 mg in the afternoon and 10 mg at bedtime.   polyethylene glycol 17 g packet Commonly known as: MIRALAX / GLYCOLAX Take 17 g by mouth daily as needed for mild constipation.   QUEtiapine 25 MG tablet Commonly known as: SEROQUEL Take 1 tablet (25 mg total) by mouth at bedtime.   senna-docusate 8.6-50 MG tablet Commonly known as: Senokot-S Take 1 tablet by mouth at bedtime as needed for mild constipation.   zinc sulfate 220 (50 Zn) MG capsule Take 1 capsule (220 mg total) by mouth daily.        Discharge Assessment: Vitals:   06/14/22 0627 06/15/22 0539  BP: (!) 144/92 138/77  Pulse: 66 (!) 59  Resp: 18 14  Temp: (!) 97.5 F (36.4 C) (!) 97.2 F (36.2 C)  SpO2: 97% 100%   Skin clean, dry and intact without evidence of skin break down, no evidence of skin tears noted. IV catheter discontinued intact. Site without signs and symptoms of complications - no redness or edema noted at insertion site, patient denies c/o pain - only slight tenderness at site.  Dressing with slight pressure applied.  D/c Instructions-Education: Discharge instructions  given to patient/family with verbalized understanding. D/c education completed with patient/family including follow up instructions, medication list, d/c activities limitations if indicated, with other d/c instructions as indicated by MD - patient able to verbalize understanding, all questions fully answered. Patient instructed to return to ED, call 911, or call MD for any changes in condition.  Patient escorted via [place], and D/C to SNF via private auto.  Tsosie Billing, LPN 01/12/9241 68:34 AM

## 2022-06-15 NOTE — Care Management Important Message (Signed)
Important Message  Patient Details  Name: Carlos Ballard MRN: [id number] Date of Birth: 03-01-1928   Medicare Important Message Given:  Yes (spoke with daughter [name] at [phone] to review letter.  No additonal copy needed.)     Tommy Medal 06/15/2022, 11:03 AM

## 2022-06-15 NOTE — Progress Notes (Signed)
OT Cancellation Note  Patient Details Name: Ryin Ambrosius MRN: [id number] DOB: 1927/11/23   Cancelled Treatment:    Reason Eval/Treat Not Completed: Patient declined, no reason specified. Pt was initially tolerate of OT in room to set up chair for transfer but when prompting pt to sit up the pt refused and did not want help. This therapist asked if the pt wanted this therapist to leave and the pt nodded yes. No reason given for refusal of treatment. Pt will benefit from continued OT in the hospital and recommended venue below to increase strength, balance, and endurance for safe ADL's. Will attempt to see pt later as time permits.   Kameela Leipold OT, MOT     Larey Seat 06/15/2022, 10:23 AM

## 2022-06-15 NOTE — Progress Notes (Signed)
Called report to Nurse Lovey Newcomer at [facility].

## 2022-06-15 NOTE — TOC Transition Note (Signed)
Transition of Care [facility]) - CM/SW Discharge Note   Patient Details  Name: Carlos Ballard MRN: [id number] Date of Birth: 07-22-1928  [facility] CM/SW Contact:  Shade Flood, LCSW Phone Number: 06/15/2022, 12:02 PM   Clinical Narrative:     Pt stable for dc to SNF today. Updated daughter that [facility] looked at pt's referral again and continue to be unable to offer bed. Daughter agreeable to dc to [facility]. She states she will transport.  Updated RN and MD. DC clinical sent electronically. RN to call report. No other TOC needs for dc.  Final next level of care: Skilled Nursing Facility Barriers to Discharge: Barriers Resolved   Patient Goals and CMS Choice Patient states their goals for this hospitalization and ongoing recovery are:: get better CMS Medicare.gov Compare Post Acute Care list provided to:: Patient Represenative (must comment) Choice offered to / list presented to : Adult Children  Discharge Placement              Patient chooses bed at: [facility] Patient to be transferred to facility by: family car Name of family member notified: [name] Patient and family notified of of transfer: 06/15/22  Discharge Plan and Services In-house Referral: Clinical Social Work   Post Acute Care Choice: [facility]                               Social Determinants of Health (SDOH) Interventions     Readmission Risk Interventions     No data to display

## 2022-06-16 DIAGNOSIS — I4891 Unspecified atrial fibrillation: Secondary | ICD-10-CM | POA: Diagnosis not present

## 2022-06-16 DIAGNOSIS — I10 Essential (primary) hypertension: Secondary | ICD-10-CM | POA: Diagnosis not present

## 2022-06-16 DIAGNOSIS — R269 Unspecified abnormalities of gait and mobility: Secondary | ICD-10-CM | POA: Diagnosis not present

## 2022-06-21 DIAGNOSIS — Z5181 Encounter for therapeutic drug level monitoring: Secondary | ICD-10-CM | POA: Diagnosis not present

## 2022-06-21 DIAGNOSIS — W19XXXA Unspecified fall, initial encounter: Secondary | ICD-10-CM | POA: Diagnosis not present

## 2022-06-21 DIAGNOSIS — E559 Vitamin D deficiency, unspecified: Secondary | ICD-10-CM | POA: Diagnosis not present

## 2022-06-21 DIAGNOSIS — I10 Essential (primary) hypertension: Secondary | ICD-10-CM | POA: Diagnosis not present

## 2022-06-21 DIAGNOSIS — R5381 Other malaise: Secondary | ICD-10-CM | POA: Diagnosis not present

## 2022-06-21 DIAGNOSIS — G919 Hydrocephalus, unspecified: Secondary | ICD-10-CM | POA: Diagnosis not present

## 2022-06-24 DIAGNOSIS — R269 Unspecified abnormalities of gait and mobility: Secondary | ICD-10-CM | POA: Diagnosis not present

## 2022-06-24 DIAGNOSIS — I10 Essential (primary) hypertension: Secondary | ICD-10-CM | POA: Diagnosis not present

## 2022-06-24 DIAGNOSIS — I4891 Unspecified atrial fibrillation: Secondary | ICD-10-CM | POA: Diagnosis not present

## 2022-06-27 DIAGNOSIS — N390 Urinary tract infection, site not specified: Secondary | ICD-10-CM | POA: Diagnosis not present

## 2022-07-01 DIAGNOSIS — I10 Essential (primary) hypertension: Secondary | ICD-10-CM | POA: Diagnosis not present

## 2022-07-01 DIAGNOSIS — R269 Unspecified abnormalities of gait and mobility: Secondary | ICD-10-CM | POA: Diagnosis not present

## 2022-07-01 DIAGNOSIS — R5381 Other malaise: Secondary | ICD-10-CM | POA: Diagnosis not present

## 2022-07-01 DIAGNOSIS — I4891 Unspecified atrial fibrillation: Secondary | ICD-10-CM | POA: Diagnosis not present

## 2022-07-07 DIAGNOSIS — R609 Edema, unspecified: Secondary | ICD-10-CM | POA: Diagnosis not present

## 2022-07-08 DIAGNOSIS — R451 Restlessness and agitation: Secondary | ICD-10-CM | POA: Diagnosis not present

## 2022-07-08 DIAGNOSIS — I10 Essential (primary) hypertension: Secondary | ICD-10-CM | POA: Diagnosis not present

## 2022-07-08 DIAGNOSIS — I4891 Unspecified atrial fibrillation: Secondary | ICD-10-CM | POA: Diagnosis not present

## 2022-07-12 DIAGNOSIS — Z299 Encounter for prophylactic measures, unspecified: Secondary | ICD-10-CM | POA: Diagnosis not present

## 2022-07-12 DIAGNOSIS — G309 Alzheimer's disease, unspecified: Secondary | ICD-10-CM | POA: Diagnosis not present

## 2022-07-12 DIAGNOSIS — R296 Repeated falls: Secondary | ICD-10-CM | POA: Diagnosis not present

## 2022-07-12 DIAGNOSIS — I10 Essential (primary) hypertension: Secondary | ICD-10-CM | POA: Diagnosis not present

## 2022-07-22 DIAGNOSIS — R5381 Other malaise: Secondary | ICD-10-CM | POA: Diagnosis not present

## 2022-07-22 DIAGNOSIS — G309 Alzheimer's disease, unspecified: Secondary | ICD-10-CM | POA: Diagnosis not present

## 2022-07-28 DIAGNOSIS — R1084 Generalized abdominal pain: Secondary | ICD-10-CM | POA: Diagnosis not present

## 2022-07-28 DIAGNOSIS — R3911 Hesitancy of micturition: Secondary | ICD-10-CM | POA: Diagnosis not present

## 2022-07-28 DIAGNOSIS — N200 Calculus of kidney: Secondary | ICD-10-CM | POA: Diagnosis not present

## 2022-07-28 DIAGNOSIS — S2241XA Multiple fractures of ribs, right side, initial encounter for closed fracture: Secondary | ICD-10-CM | POA: Diagnosis not present

## 2022-07-28 DIAGNOSIS — W19XXXA Unspecified fall, initial encounter: Secondary | ICD-10-CM | POA: Diagnosis not present

## 2022-07-28 DIAGNOSIS — Z743 Need for continuous supervision: Secondary | ICD-10-CM | POA: Diagnosis not present

## 2022-08-01 DIAGNOSIS — Z743 Need for continuous supervision: Secondary | ICD-10-CM | POA: Diagnosis not present

## 2022-08-01 DIAGNOSIS — R531 Weakness: Secondary | ICD-10-CM | POA: Diagnosis not present

## 2022-08-01 DIAGNOSIS — I10 Essential (primary) hypertension: Secondary | ICD-10-CM | POA: Diagnosis not present

## 2022-08-01 DIAGNOSIS — R410 Disorientation, unspecified: Secondary | ICD-10-CM | POA: Diagnosis not present

## 2022-08-01 DIAGNOSIS — F02C0 Dementia in other diseases classified elsewhere, severe, without behavioral disturbance, psychotic disturbance, mood disturbance, and anxiety: Secondary | ICD-10-CM | POA: Diagnosis not present

## 2022-08-01 DIAGNOSIS — Z888 Allergy status to other drugs, medicaments and biological substances status: Secondary | ICD-10-CM | POA: Diagnosis not present

## 2022-08-01 DIAGNOSIS — N200 Calculus of kidney: Secondary | ICD-10-CM | POA: Diagnosis not present

## 2022-08-01 DIAGNOSIS — Z982 Presence of cerebrospinal fluid drainage device: Secondary | ICD-10-CM | POA: Diagnosis not present

## 2022-08-01 DIAGNOSIS — M47816 Spondylosis without myelopathy or radiculopathy, lumbar region: Secondary | ICD-10-CM | POA: Diagnosis not present

## 2022-08-01 DIAGNOSIS — R569 Unspecified convulsions: Secondary | ICD-10-CM | POA: Diagnosis not present

## 2022-08-04 DIAGNOSIS — I10 Essential (primary) hypertension: Secondary | ICD-10-CM | POA: Diagnosis not present

## 2022-08-04 DIAGNOSIS — G309 Alzheimer's disease, unspecified: Secondary | ICD-10-CM | POA: Diagnosis not present

## 2022-10-17 DEATH — deceased
